# Patient Record
Sex: Female | Born: 2000 | Race: White | Hispanic: Yes | Marital: Single | Smoking: Never smoker
Health system: Southern US, Community
[De-identification: ages and names within clinical notes are randomized; demographics above are authoritative.]

## PROBLEM LIST (undated history)

## (undated) ENCOUNTER — Inpatient Hospital Stay: Payer: Self-pay

## (undated) DIAGNOSIS — F32A Depression, unspecified: Secondary | ICD-10-CM

## (undated) DIAGNOSIS — R002 Palpitations: Secondary | ICD-10-CM

## (undated) DIAGNOSIS — F419 Anxiety disorder, unspecified: Secondary | ICD-10-CM

## (undated) DIAGNOSIS — J45909 Unspecified asthma, uncomplicated: Secondary | ICD-10-CM

## (undated) DIAGNOSIS — I10 Essential (primary) hypertension: Secondary | ICD-10-CM

## (undated) DIAGNOSIS — K219 Gastro-esophageal reflux disease without esophagitis: Secondary | ICD-10-CM

## (undated) DIAGNOSIS — R030 Elevated blood-pressure reading, without diagnosis of hypertension: Secondary | ICD-10-CM

## (undated) DIAGNOSIS — J302 Other seasonal allergic rhinitis: Secondary | ICD-10-CM

## (undated) DIAGNOSIS — R011 Cardiac murmur, unspecified: Secondary | ICD-10-CM

## (undated) HISTORY — PX: OTHER SURGICAL HISTORY: SHX169

## (undated) HISTORY — DX: Elevated blood-pressure reading, without diagnosis of hypertension: R03.0

## (undated) HISTORY — DX: Anxiety disorder, unspecified: F41.9

## (undated) HISTORY — DX: Depression, unspecified: F32.A

## (undated) HISTORY — DX: Essential (primary) hypertension: I10

## (undated) HISTORY — DX: Cardiac murmur, unspecified: R01.1

---
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
## 2021-05-26 ENCOUNTER — Ambulatory Visit (INDEPENDENT_AMBULATORY_CARE_PROVIDER_SITE_OTHER): Payer: Medicaid Other | Admitting: Certified Nurse Midwife

## 2021-05-26 ENCOUNTER — Other Ambulatory Visit: Payer: Self-pay

## 2021-05-26 ENCOUNTER — Encounter: Payer: Self-pay | Admitting: Certified Nurse Midwife

## 2021-05-26 VITALS — BP 159/97 | HR 90 | Ht 64.0 in | Wt 250.6 lb

## 2021-05-26 DIAGNOSIS — Z3200 Encounter for pregnancy test, result unknown: Secondary | ICD-10-CM | POA: Diagnosis not present

## 2021-05-26 LAB — POCT URINE PREGNANCY: Preg Test, Ur: POSITIVE — AB

## 2021-05-26 MED ORDER — LABETALOL HCL 100 MG PO TABS: 100.0000 mg | ORAL_TABLET | Freq: Two times a day (BID) | ORAL | 11 refills | Status: DC

## 2021-05-26 NOTE — Progress Notes (Signed)
Subjective:    Joyce Smith is a 21 y.o. female who presents for evaluation of amenorrhea. She believes she could be pregnant. Pregnancy is desired. Sexual Activity: single partner, contraception: none. Current symptoms also include: fatigue and positive home pregnancy test. Last period was normal.   Patient's last menstrual period was 04/16/2021 (exact date). The following portions of the patient's history were reviewed and updated as appropriate: allergies, current medications, past family history, past medical history, past social history, past surgical history, and problem list.  Review of Systems Pertinent items are noted in HPI.     Objective:    BP (!) 159/97    Pulse 90    Ht 5\' 4"  (1.626 m)    Wt 250 lb 9.6 oz (113.7 kg)    LMP 04/16/2021 (Exact Date)    BMI 43.02 kg/m  General: alert, cooperative, appears stated age, and no acute distress    Lab Review Urine HCG: positive    Assessment:    Absence of menstruation.     Plan:    Pregnancy Test:  Positive: EDC: 01/21/2022. Briefly discussed pre-natal care options. Pt has hypertension not currently on medications. Dr. 01/23/2022 consulted started on labetalol 100 mg BID. Logan Bores Encouraged well-balanced diet, plenty of rest when needed, pre-natal vitamins daily and walking for exercise. Discussed self-help for nausea, avoiding OTC medications until consulting provider or pharmacist, other than Tylenol as needed, minimal caffeine (1-2 cups daily) and avoiding alcohol. She will follow up 2-3 days for BP check. She will schedule dating u/s 2 wks, nurse visit in 5 wks,   her initial OB visit in the next at [redacted] wks pregnant. . Feel free to call with any questions.   Marland Kitchen, CNM

## 2021-05-26 NOTE — Patient Instructions (Signed)
Prenatal Care ?Prenatal care is health care during pregnancy. It helps you and your unborn baby (fetus) stay as healthy as possible. Prenatal care may be provided by a midwife, a family practice doctor, a mid-level practitioner (nurse practitioner or physician assistant), or a childbirth and pregnancy doctor (obstetrician). ?How does this affect me? ?During pregnancy, you will be closely monitored for any new conditions that might develop. To lower your risk of pregnancy complications, you and your health care provider will talk about any underlying conditions you have. ?How does this affect my baby? ?Early and consistent prenatal care increases the chance that your baby will be healthy during pregnancy. Prenatal care lowers the risk that your baby will be: ?Born early (prematurely). ?Smaller than expected at birth (small for gestational age). ?What can I expect at the first prenatal care visit? ?Your first prenatal care visit will likely be the longest. You should schedule your first prenatal care visit as soon as you know that you are pregnant. Your first visit is a good time to talk about any questions or concerns you have about pregnancy. ?Medical history ?At your visit, you and your health care provider will talk about your medical history, including: ?Any past pregnancies. ?Your family's medical history. ?Medical history of the baby's father. ?Any long-term (chronic) health conditions you have and how you manage them. ?Any surgeries or procedures you have had. ?Any current over-the-counter or prescription medicines, herbs, or supplements that you are taking. ?Other factors that could pose a risk to your baby, including: ?Exposure to harmful chemicals or radiation at work or at home. ?Any substance use, including tobacco, alcohol, and drug use. ?Your home setting and your stress levels, including: ?Exposure to abuse or violence. ?Household financial strain. ?Your daily health habits, including diet and  exercise. ?Tests and screenings ?Your health care provider will: ?Measure your weight, height, and blood pressure. ?Do a physical exam, including a pelvic and breast exam. ?Perform blood tests and urine tests to check for: ?Urinary tract infection. ?Sexually transmitted infections (STIs). ?Low iron levels in your blood (anemia). ?Blood type and certain proteins on red blood cells (Rh antibodies). ?Infections and immunity to viruses, such as hepatitis B and rubella. ?HIV (human immunodeficiency virus). ?Discuss your options for genetic screening. ?Tips about staying healthy ?Your health care provider will also give you information about how to keep yourself and your baby healthy, including: ?Nutrition and taking vitamins. ?Physical activity. ?How to manage pregnancy symptoms such as nausea and vomiting (morning sickness). ?Infections and substances that may be harmful to your baby and how to avoid them. ?Food safety. ?Dental care. ?Working. ?Travel. ?Warning signs to watch for and when to call your health care provider. ?How often will I have prenatal care visits? ?After your first prenatal care visit, you will have regular visits throughout your pregnancy. The visit schedule is often as follows: ?Up to week 28 of pregnancy: once every 4 weeks. ?28-36 weeks: once every 2 weeks. ?After 36 weeks: every week until delivery. ?Some women may have visits more or less often depending on any underlying health conditions and the health of the baby. ?Keep all follow-up and prenatal care visits. This is important. ?What happens during routine prenatal care visits? ?Your health care provider will: ?Measure your weight and blood pressure. ?Check for fetal heart sounds. ?Measure the height of your uterus in your abdomen (fundal height). This may be measured starting around week 20 of pregnancy. ?Check the position of your baby inside your uterus. ?Ask questions   about your diet, sleeping patterns, and whether you can feel the baby  move. ?Review warning signs to watch for and signs of labor. ?Ask about any pregnancy symptoms you are having and how you are dealing with them. Symptoms may include: ?Headaches. ?Nausea and vomiting. ?Vaginal discharge. ?Swelling. ?Fatigue. ?Constipation. ?Changes in your vision. ?Feeling persistently sad or anxious. ?Any discomfort, including back or pelvic pain. ?Bleeding or spotting. ?Make a list of questions to ask your health care provider at your routine visits. ?What tests might I have during prenatal care visits? ?You may have blood, urine, and imaging tests throughout your pregnancy, such as: ?Urine tests to check for glucose, protein, or signs of infection. ?Glucose tests to check for a form of diabetes that can develop during pregnancy (gestational diabetes mellitus). This is usually done around week 24 of pregnancy. ?Ultrasounds to check your baby's growth and development, to check for birth defects, and to check your baby's well-being. These can also help to decide when you should deliver your baby. ?A test to check for group B strep (GBS) infection. This is usually done around week 36 of pregnancy. ?Genetic testing. This may include blood, fluid, or tissue sampling, or imaging tests, such as an ultrasound. Some genetic tests are done during the first trimester and some are done during the second trimester. ?What else can I expect during prenatal care visits? ?Your health care provider may recommend getting certain vaccines during pregnancy. These may include: ?A yearly flu shot (annual influenza vaccine). This is especially important if you will be pregnant during flu season. ?Tdap (tetanus, diphtheria, pertussis) vaccine. Getting this vaccine during pregnancy can protect your baby from whooping cough (pertussis) after birth. This vaccine may be recommended between weeks 27 and 36 of pregnancy. ?A COVID-19 vaccine. ?Later in your pregnancy, your health care provider may give you information  about: ?Childbirth and breastfeeding classes. ?Choosing a health care provider for your baby. ?Umbilical cord banking. ?Breastfeeding. ?Birth control after your baby is born. ?The hospital labor and delivery unit and how to set up a tour. ?Registering at the hospital before you go into labor. ?Where to find more information ?Office on Women's Health: womenshealth.gov ?American Pregnancy Association: americanpregnancy.org ?March of Dimes: marchofdimes.org ?Summary ?Prenatal care helps you and your baby stay as healthy as possible during pregnancy. ?Your first prenatal care visit will most likely be the longest. ?You will have visits and tests throughout your pregnancy to monitor your health and your baby's health. ?Bring a list of questions to your visits to ask your health care provider. ?Make sure to keep all follow-up and prenatal care visits. ?This information is not intended to replace advice given to you by your health care provider. Make sure you discuss any questions you have with your health care provider. ?Document Revised: 02/08/2020 Document Reviewed: 02/08/2020 ?Elsevier Patient Education ? 2022 Elsevier Inc. ? ?

## 2021-05-27 ENCOUNTER — Telehealth: Payer: Self-pay

## 2021-05-28 ENCOUNTER — Encounter: Payer: Self-pay | Admitting: Certified Nurse Midwife

## 2021-05-28 ENCOUNTER — Other Ambulatory Visit: Payer: Self-pay

## 2021-05-28 ENCOUNTER — Ambulatory Visit (INDEPENDENT_AMBULATORY_CARE_PROVIDER_SITE_OTHER): Payer: Medicaid Other | Admitting: Certified Nurse Midwife

## 2021-05-28 VITALS — BP 139/71 | HR 90 | Wt 250.5 lb

## 2021-05-28 DIAGNOSIS — O161 Unspecified maternal hypertension, first trimester: Secondary | ICD-10-CM

## 2021-05-28 MED ORDER — BLOOD PRESSURE MONITOR/L CUFF MISC: 1.0000 | Freq: Every day | 0 refills | Status: DC | PRN

## 2021-05-28 MED ORDER — METRONIDAZOLE 0.75 % VA GEL: 1.0000 | Freq: Every day | VAGINAL | 0 refills | Status: AC

## 2021-05-28 NOTE — Progress Notes (Signed)
Patient presents for BP check per AT in first trimester of pregnancy.

## 2021-05-28 NOTE — Progress Notes (Signed)
Pt bp remains elevated 143/86 , rpt after sitting for 15 min. 139/74. Dr. Valentino Saxon consulted. Labetalol increased to 200 mg BID. BP cuff ordered pt instructed to take BP if feeling light headed of dizzy. If BP low she is to return to 100 mg dose . She will follow up in office Monday for BP check. Pt state she was seen by urgent care a week ago a given a prescription flagyl due to BV. Given that she is pregnant she did not take . Order placed fo [facility] for treatment.   Doreene Burke, CNM

## 2021-06-02 ENCOUNTER — Encounter: Payer: Self-pay | Admitting: Certified Nurse Midwife

## 2021-06-02 ENCOUNTER — Ambulatory Visit: Payer: Medicaid Other

## 2021-06-03 ENCOUNTER — Encounter: Payer: Self-pay | Admitting: Certified Nurse Midwife

## 2021-06-03 ENCOUNTER — Encounter: Payer: Medicaid Other | Admitting: Certified Nurse Midwife

## 2021-06-03 ENCOUNTER — Ambulatory Visit (INDEPENDENT_AMBULATORY_CARE_PROVIDER_SITE_OTHER): Payer: Medicaid Other | Admitting: Certified Nurse Midwife

## 2021-06-03 ENCOUNTER — Other Ambulatory Visit: Payer: Self-pay

## 2021-06-03 VITALS — BP 131/84 | HR 92 | Wt 252.5 lb

## 2021-06-03 DIAGNOSIS — Z0130 Encounter for examination of blood pressure without abnormal findings: Secondary | ICD-10-CM

## 2021-06-03 NOTE — Progress Notes (Signed)
Pt presents for BP check. First BP - 148/77; made AT aware and she advised patient rest and repeat after 15 min. Second BP - 131/84, AT approved. Patient states she has only be taking 100mg  labetalol BID due to feeling clammy/fatigued when taking 200mg . pt also reports bp of 103/71 @ home when taking 200mg . AT approved until further notice for patient to continue 100mg  BID and to continue to monitor at home. Patient verbalizes understanding and agrees to plan.

## 2021-06-05 ENCOUNTER — Telehealth: Payer: Self-pay

## 2021-06-09 ENCOUNTER — Encounter: Payer: Self-pay | Admitting: Certified Nurse Midwife

## 2021-06-09 ENCOUNTER — Other Ambulatory Visit: Payer: Medicaid Other

## 2021-06-09 NOTE — Telephone Encounter (Signed)
Provider spoke with patient, all questions answered.

## 2021-06-11 ENCOUNTER — Encounter: Payer: Self-pay | Admitting: Certified Nurse Midwife

## 2021-06-11 ENCOUNTER — Other Ambulatory Visit: Payer: Self-pay

## 2021-06-11 ENCOUNTER — Ambulatory Visit (INDEPENDENT_AMBULATORY_CARE_PROVIDER_SITE_OTHER): Payer: Medicaid Other

## 2021-06-11 ENCOUNTER — Telehealth (INDEPENDENT_AMBULATORY_CARE_PROVIDER_SITE_OTHER): Payer: Medicaid Other | Admitting: Certified Nurse Midwife

## 2021-06-11 VITALS — BP 131/84 | HR 79 | Wt 249.8 lb

## 2021-06-11 DIAGNOSIS — Z3200 Encounter for pregnancy test, result unknown: Secondary | ICD-10-CM

## 2021-06-11 DIAGNOSIS — Z3401 Encounter for supervision of normal first pregnancy, first trimester: Secondary | ICD-10-CM

## 2021-06-11 DIAGNOSIS — Z3A08 8 weeks gestation of pregnancy: Secondary | ICD-10-CM

## 2021-06-11 DIAGNOSIS — O209 Hemorrhage in early pregnancy, unspecified: Secondary | ICD-10-CM

## 2021-06-11 LAB — POCT URINALYSIS DIPSTICK OB
Bilirubin, UA: NEGATIVE
Glucose, UA: NEGATIVE
Ketones, UA: NEGATIVE
Leukocytes, UA: NEGATIVE
Nitrite, UA: NEGATIVE
POC,PROTEIN,UA: NEGATIVE
Spec Grav, UA: 1.01 (ref 1.010–1.025)
Urobilinogen, UA: 0.2 E.U./dL
pH, UA: 6.5 (ref 5.0–8.0)

## 2021-06-11 NOTE — Progress Notes (Signed)
Pt presents today due to bleeding in early pregnancy. Had u/s today for dating. FHR noted 149. No evidence of subchorionic hematoma (per u/s tech. Report not yet completed). Pt state she has been having spotting , she is not wearing a pad , showed me a picture of dark streak with small amount clotting on toilet tissue. Discussed with pt that dark color is indicative of old blood and that it will hopefully be stopping soon. Discussed possible cause of bleeding including intercourse, vigorous physical activity , or constipation. Bleeding precautions reviewed. U/s tech notes some abnormalcies in decidual size and recommend follow up u/s in 2 wks. Orders placed. Follow up for rp u/s and NOB as scheduled.   Doreene Burke, CNM

## 2021-06-12 ENCOUNTER — Telehealth: Payer: Self-pay | Admitting: Emergency Medicine

## 2021-06-12 ENCOUNTER — Encounter: Payer: Self-pay | Admitting: Emergency Medicine

## 2021-06-12 ENCOUNTER — Emergency Department: Payer: Medicaid Other

## 2021-06-12 ENCOUNTER — Other Ambulatory Visit: Payer: Self-pay

## 2021-06-12 ENCOUNTER — Emergency Department
Admission: EM | Admit: 2021-06-12 | Discharge: 2021-06-12 | Disposition: A | Discharge status: Home / Self Care | Payer: Medicaid Other | Attending: Emergency Medicine | Admitting: Emergency Medicine

## 2021-06-12 DIAGNOSIS — O209 Hemorrhage in early pregnancy, unspecified: Secondary | ICD-10-CM | POA: Diagnosis present

## 2021-06-12 DIAGNOSIS — Z3A01 Less than 8 weeks gestation of pregnancy: Secondary | ICD-10-CM | POA: Diagnosis not present

## 2021-06-12 DIAGNOSIS — O4690 Antepartum hemorrhage, unspecified, unspecified trimester: Secondary | ICD-10-CM

## 2021-06-12 LAB — COMPREHENSIVE METABOLIC PANEL
ALT: 14 U/L (ref 0–44)
AST: 17 U/L (ref 15–41)
Albumin: 4.2 g/dL (ref 3.5–5.0)
Alkaline Phosphatase: 66 U/L (ref 38–126)
Anion gap: 6 (ref 5–15)
BUN: 6 mg/dL (ref 6–20)
CO2: 24 mmol/L (ref 22–32)
Calcium: 9.1 mg/dL (ref 8.9–10.3)
Chloride: 105 mmol/L (ref 98–111)
Creatinine, Ser: 0.52 mg/dL (ref 0.44–1.00)
GFR, Estimated: >60 mL/min (ref >60)
Glucose, Bld: 101 mg/dL — ABNORMAL HIGH (ref 70–99)
Potassium: 3.8 mmol/L (ref 3.5–5.1)
Sodium: 135 mmol/L (ref 135–145)
Total Bilirubin: 0.6 mg/dL (ref 0.3–1.2)
Total Protein: 7.7 g/dL (ref 6.5–8.1)

## 2021-06-12 LAB — CBC WITH DIFFERENTIAL/PLATELET
Abs Immature Granulocytes: 0.03 K/uL (ref 0.00–0.07)
Basophils Absolute: 0.1 K/uL (ref 0.0–0.1)
Basophils Relative: 1 %
Eosinophils Absolute: 0.5 K/uL (ref 0.0–0.5)
Eosinophils Relative: 4 %
HCT: 39.5 % (ref 36.0–46.0)
Hemoglobin: 12.7 g/dL (ref 12.0–15.0)
Immature Granulocytes: 0 %
Lymphocytes Relative: 13 %
Lymphs Abs: 1.4 K/uL (ref 0.7–4.0)
MCH: 27.1 pg (ref 26.0–34.0)
MCHC: 32.2 g/dL (ref 30.0–36.0)
MCV: 84.2 fL (ref 80.0–100.0)
Monocytes Absolute: 0.6 K/uL (ref 0.1–1.0)
Monocytes Relative: 5 %
Neutro Abs: 8.8 K/uL — ABNORMAL HIGH (ref 1.7–7.7)
Neutrophils Relative %: 77 %
Platelets: 331 K/uL (ref 150–400)
RBC: 4.69 MIL/uL (ref 3.87–5.11)
RDW: 13 % (ref 11.5–15.5)
WBC: 11.4 K/uL — ABNORMAL HIGH (ref 4.0–10.5)
nRBC: 0 % (ref 0.0–0.2)

## 2021-06-12 LAB — ABO/RH: ABO/RH(D): O POS

## 2021-06-12 LAB — WET PREP, GENITAL
Clue Cells Wet Prep HPF POC: NONE SEEN
Sperm: NONE SEEN
Trich, Wet Prep: NONE SEEN
WBC, Wet Prep HPF POC: <10 (ref <10)
Yeast Wet Prep HPF POC: NONE SEEN

## 2021-06-12 LAB — HCG, QUANTITATIVE, PREGNANCY: hCG, Beta Chain, Quant, S: 12750 m[IU]/mL — ABNORMAL HIGH (ref <5)

## 2021-06-12 LAB — POC URINE PREG, ED: Preg Test, Ur: POSITIVE — AB

## 2021-06-12 NOTE — Telephone Encounter (Unsigned)
This RN received phone call from lab stating unable to run GC/Chlamydia test, Joyce Smith made aware, acknowledges, states patient does not need to come back for recollection at this time.

## 2021-06-12 NOTE — ED Triage Notes (Signed)
Patient to ED for vaginal bleeding. Patient states she is [redacted] weeks pregnant. She states having light bleeding for the past week but became heavier today. Patient ambulatory to triage.

## 2021-06-12 NOTE — ED Notes (Signed)
Pt reports a week of light brown blood upon wiping. States is [redacted] weeks pregnant. Reports bleeding today a little bit more; brighter in color and noted on underwear per pt. States had ultrasound yesterday and doc told her there were changes to it that she wasn't expecting but didn't go into more detail. Pt reports cramping.

## 2021-06-12 NOTE — ED Provider Notes (Signed)
[facility] Provider Note  Patient Contact: 4:43 PM (approximate)   History   Vaginal Bleeding   HPI  DEMESHIA Smith is a 21 y.o. female who presents to the emergency department concern for vaginal bleeding in the setting of pregnancy.  Patient does have an OB/GYN, was seen yesterday and had an ultrasound which revealed a possible abnormality in the gestational sac.  Patient states that she is scheduled for repeat ultrasound in 2 weeks.  She has had some brown discharge and had been recently treated for BV.  Patient was using Flagyl suppositories for same.  She was told that if she had any true vaginal bleeding she should present to the ED and roughly 1:00 this afternoon patient had some bright red blood.  Patient has had some mild pelvic cramping but no frank pain.  No emesis, diarrhea, urinary changes.  Patient was 7 weeks 3 days by measurement on yesterday's ultrasound.     Physical Exam   Triage Vital Signs: ED Triage Vitals  Enc Vitals Group     BP 06/12/21 1517 138/80     Pulse Rate 06/12/21 1517 78     Resp 06/12/21 1517 17     Temp 06/12/21 1517 98.5 F (36.9 C)     Temp Source 06/12/21 1517 Oral     SpO2 06/12/21 1517 99 %     Weight 06/12/21 1513 250 lb (113.4 kg)     Height 06/12/21 1513 5\' 4"  (1.626 m)     Head Circumference --      Peak Flow --      Pain Score 06/12/21 1513 6     Pain Loc --      Pain Edu? --      Excl. in GC? --     Most recent vital signs: Vitals:   06/12/21 1600 06/12/21 1823  BP: (!) 154/64 (!) 148/68  Pulse: 75 70  Resp:  16  Temp:    SpO2: 97% 98%     General: Alert and in no acute distress.  Cardiovascular:  Good peripheral perfusion Respiratory: Normal respiratory effort without tachypnea or retractions. Lungs CTAB.  Gastrointestinal: Bowel sounds 4 quadrants. Soft and nontender to palpation. No guarding or rigidity. No palpable masses. No distention. No CVA tenderness. Genitourinary: External  genital exam revealed no lesions.  Pelvic exam revealed no vaginal tears.  Small amount of congealed blood noted in the vaginal canal.  No other frank discharge.  Cervix closed and unremarkable at this time.  Swabs are obtained Musculoskeletal: Full range of motion to all extremities.  Neurologic:  No gross focal neurologic deficits are appreciated.  Skin:   No rash noted Other:   Pelvic exam is chaperoned by female 08/10/21 and female PA student.  ED Results / Procedures / Treatments   Labs (all labs ordered are listed, but only abnormal results are displayed) Labs Reviewed  CBC WITH DIFFERENTIAL/PLATELET - Abnormal; Notable for the following components:      Result Value   WBC 11.4 (*)    Neutro Abs 8.8 (*)    All other components within normal limits  COMPREHENSIVE METABOLIC PANEL - Abnormal; Notable for the following components:   Glucose, Bld 101 (*)    All other components within normal limits  HCG, QUANTITATIVE, PREGNANCY - Abnormal; Notable for the following components:   hCG, Beta Chain, Quant, S 12,750 (*)    All other components within normal limits  POC URINE PREG, ED - Abnormal; Notable  for the following components:   Preg Test, Ur POSITIVE (*)    All other components within normal limits  WET PREP, GENITAL  CHLAMYDIA/NGC RT PCR ([facility] ONLY)            ABO/RH     EKG     RADIOLOGY  I personally viewed and evaluated these images as part of my medical decision making, as well as reviewing the written report by the radiologist.  ED Provider Interpretation: Intrauterine pregnancy measuring roughly 7 weeks 3 days.  No evidence of subchorionic hemorrhage  US OB LESS THAN 14 WEEKS WITH OB TRANSVAGINAL  Result Date: 06/12/2021 CLINICAL DATA:  Vaginal bleeding EXAM: OBSTETRIC <14 WK US AND TRANSVAGINAL OB US TECHNIQUE: Both transabdominal and transvaginal ultrasound examinations were performed for complete evaluation of the gestation as well as the maternal uterus, adnexal  regions, and pelvic cul-de-sac. Transvaginal technique was performed to assess early pregnancy. COMPARISON:  06/11/2021 FINDINGS: Intrauterine gestational sac: Single Yolk sac:  Visualized. Embryo:  Visualized. Cardiac Activity: Visualized. Heart Rate: 162 bpm CRL:  12.3 mm   7 w   3 d                  US EDC: 01/26/2022 Subchorionic hemorrhage:  None visualized. Maternal uterus/adnexae: Left ovary measures 3.5 x 2.0 x 2.4 cm. Right ovary is not well seen. No free fluid. IMPRESSION: 1. Single live intrauterine pregnancy as above, estimated age 21 weeks and 3 days. Electronically Signed   By: Sharlet SalinaMichael  Brown M.D.   On: 06/12/2021 17:49    PROCEDURES:  Critical Care performed: No  Procedures   MEDICATIONS ORDERED IN ED: Medications - No data to display   IMPRESSION / MDM / ASSESSMENT AND PLAN / ED COURSE  I reviewed the triage vital signs and the nursing notes.                              Differential diagnosis includes, but is not limited to, vaginal bleeding in pregnancy, BV, candidal infection, subchorionic hemorrhage, miscarriage   Patient's diagnosis is consistent with vaginal bleeding in pregnancy.  Patient presented to the emergency department complaining of some vaginal bleeding.  Patient was seen yesterday with her OB/GYN, had an ultrasound which had some concerning findings on gestational sac.  She was advised to present to the ED for any vaginal bleeding and had a small amount of bleeding today.  Largely asymptomatic other than vaginal bleeding at this time.  Labs are reassuring, patient with reassuring ultrasound with intrauterine pregnancy with no evidence of subchorionic hemorrhage.  At this time patient also had a pelvic exam and she said that she had some vaginal itching and was recently diagnosed with BV though she had taken antibiotic suppositories for same.  Patient had no clue cells, yeast, trichomoniasis.  Gonorrhea and Chlamydia had also been ordered but patient states that  she has no concerns for either and does not wish to wait for the return of the swabs.  If the swabs are positive she may return for antibiotics.  Otherwise follow-up with OB/GYN as previously scheduled.  Return precautions discussed with the patient.  No medications prescribed at this time..  Patient is given ED precautions to return to the ED for any worsening or new symptoms.        FINAL CLINICAL IMPRESSION(S) / ED DIAGNOSES   Final diagnoses:  Vaginal bleeding in pregnancy     Rx / DC  Orders   ED Discharge Orders     None        Note:  This document was prepared using Dragon voice recognition software and may include unintentional dictation errors.   Lanette Hampshire 06/12/21 2036    Shaune Pollack, MD 06/12/21 2239

## 2021-06-20 ENCOUNTER — Emergency Department
Admission: EM | Admit: 2021-06-20 | Discharge: 2021-06-20 | Disposition: A | Discharge status: Home / Self Care | Payer: Medicaid Other | Attending: Emergency Medicine | Admitting: Emergency Medicine

## 2021-06-20 ENCOUNTER — Other Ambulatory Visit: Payer: Self-pay

## 2021-06-20 ENCOUNTER — Encounter: Payer: Self-pay | Admitting: Emergency Medicine

## 2021-06-20 DIAGNOSIS — O26851 Spotting complicating pregnancy, first trimester: Secondary | ICD-10-CM | POA: Insufficient documentation

## 2021-06-20 DIAGNOSIS — O034 Incomplete spontaneous abortion without complication: Secondary | ICD-10-CM | POA: Diagnosis present

## 2021-06-20 DIAGNOSIS — Z3A08 8 weeks gestation of pregnancy: Secondary | ICD-10-CM | POA: Diagnosis not present

## 2021-06-20 NOTE — Discharge Instructions (Signed)
As we discussed, it appears that you are having a miscarriage and that your body is going through the natural process.  Dr. Logan Bores at [facility] agrees that most likely you will complete this process without requiring any medication or procedures.  However we recommend that you call the clinic in the morning and tell them what is going on, including that you came to the emergency department and that the ER doctor talked with Dr. Logan Bores, and see if they can see you today for a follow-up appointment.  If at any point you develop worsening bleeding or develop new symptoms that concern you, please go directly to the clinic or return to the nearest emergency department.  Drink plenty of fluids to stay hydrated and use over-the-counter Tylenol according to label instructions for pain.

## 2021-06-20 NOTE — ED Provider Notes (Signed)
[facility] Provider Note    Event Date/Time   First MD Initiated Contact with Patient 06/20/21 0413     (approximate)   History   Vaginal Bleeding   HPI  Joyce Smith is a 21 y.o. female    who presents for vaginal bleeding and passage of what appeared to be products of conception during early pregnancy.  She reports that she is a G1 P0 at approximately [redacted] weeks gestation who has been followed by Doreene Burke at [facility].  The patient reports that she has been having vaginal bleeding throughout the pregnancy.  However today it got significantly worse and tonight she started having worsening cramps.  The blood flow is heavier.  She went to the bathroom thinking that she did have a bowel movement and when she bears down she passed what appears to be a gestational sac which she brought with her in a plastic bag.  She came to the ED for further evaluation.  She reports still having some vaginal bleeding and pelvic cramping.  No other passage of tissue.     Physical Exam   Triage Vital Signs: ED Triage Vitals  Enc Vitals Group     BP 06/20/21 0402 (!) 135/59     Pulse Rate 06/20/21 0402 81     Resp 06/20/21 0402 18     Temp 06/20/21 0402 98.5 F (36.9 C)     Temp Source 06/20/21 0402 Oral     SpO2 06/20/21 0402 100 %     Weight 06/20/21 0359 113.4 kg (250 lb)     Height 06/20/21 0359 1.626 m (5\' 4" )     Head Circumference --      Peak Flow --      Pain Score 06/20/21 0359 10     Pain Loc --      Pain Edu? --      Excl. in GC? --     Most recent vital signs: Vitals:   06/20/21 0402 06/20/21 0507  BP: (!) 135/59 (!) 118/55  Pulse: 81 77  Resp: 18 15  Temp: 98.5 F (36.9 C)   SpO2: 100% 100%     General: Awake, no physical distress, emotionally upset. CV:  Good peripheral perfusion.  Resp:  Normal effort.  Abd:  No distention.  No guarding, no rebound, no tenderness to palpation. Other:  Normal external pelvic exam.   Moderate amount of blood in vagina.  Products of conception in cervical os, easily removed with ring forceps.  Some difficulty visualizing cervical os, cannot completely verify that no other products are in place, but digital exam also did not reveal any additional products of conception.  No significant tenderness to palpation or cervical motion tenderness on digital exam.  ED nurse present throughout exam as chaperone.   ED Results / Procedures / Treatments    PROCEDURES:  Critical Care performed: No  Procedures   MEDICATIONS ORDERED IN ED: Medications - No data to display   IMPRESSION / MDM / ASSESSMENT AND PLAN / ED COURSE  I reviewed the triage vital signs and the nursing notes.                              Differential diagnosis includes, but is not limited to, spontaneous abortion of any number of Fridays (inevitable, completed, etc.), nonviable pregnancy, subchorionic hemorrhage, placental abruption.  Physically the patient is doing well and is hemodynamically stable.  She is obviously upset over the probable pregnancy loss.  She has what appears to be an intact gestational sac with her in a plastic baggy at bedside.  No significant tenderness to palpation of the abdomen and no guarding.  We will perform a pelvic exam to look for indication of additional products of conception within the vagina or cervical os.  I will then likely touch base with OB/GYN (Dr. Logan Bores is on-call for [facility]) and discuss additional management which may or may not include possibilities such as ultrasound, pathology for the products of conception, and/or close follow-up in clinic today.  I reviewed the patient's medical record including blood test from about a week ago (06/12/2021) indicating a beta-hCG of 12,750 and ABO/Rh indicating that she has O+ blood.  No indication for RhoGAM.  I also reviewed the ED visit including ultrasound from 06/12/2020 indicating a single live intrauterine pregnancy at  approximately 7 weeks and 3 days gestation.  At that time there is no evidence of any subchorionic hemorrhage.   Clinical Course as of 06/20/21 0512  Fri Jun 20, 2021  [id number] Performed pelvic exam with results as documented above: There were some products of conception in the cervical os which I removed with ring forceps.  Moderate amount of vaginal bleeding which increased briefly after removing the proximal of conception but then stabilized.  Patient is otherwise asymptomatic with no lightheadedness or symptomatic anemia.  I called and discussed the case by phone with Dr. Logan Bores with [facility].  He agreed with my management thus far and agreed that lab work was not necessary, including CBC, hCG, or repeat blood type.  He agreed that an ultrasound is unlikely to be helpful since the miscarriage is still in proress. He would not recommend pathology for the products of conception.  He would not recommend any medication management.  He said that most likely the miscarriage will proceed naturally.  He agreed with my plan that the patient should call in [facility] first thing in the morning and schedule follow-up appointment.  He stressed that if she is having worsening bleeding that she should definitely come to the clinic and at that point she may need a D&C, but at this point she should be able to be managed conservatively.  I went over all of this with the patient and she understands and agrees.  I gave strict return precautions and follow-up recommendations and she agrees with the plan. [CF]    Clinical Course User Index [CF] Loleta Rose, MD     FINAL CLINICAL IMPRESSION(S) / ED DIAGNOSES   Final diagnoses:  Incomplete miscarriage     Rx / DC Orders   ED Discharge Orders     None        Note:  This document was prepared using Dragon voice recognition software and may include unintentional dictation errors.   Loleta Rose, MD 06/20/21 [phone]

## 2021-06-20 NOTE — ED Triage Notes (Signed)
Pt to triage via w/c, tearful; G1, EDC 9/17 (approx [redacted]wks pregnant), pt at [facility] with vag bleeding throughout pregnancy; O+; reports cont lower abd cramping, bleeding and passed "sac" tonight

## 2021-06-23 ENCOUNTER — Ambulatory Visit (INDEPENDENT_AMBULATORY_CARE_PROVIDER_SITE_OTHER): Payer: Medicaid Other | Admitting: Certified Nurse Midwife

## 2021-06-23 ENCOUNTER — Other Ambulatory Visit: Payer: Self-pay

## 2021-06-23 ENCOUNTER — Encounter: Payer: Self-pay | Admitting: Certified Nurse Midwife

## 2021-06-23 VITALS — BP 145/97 | HR 90 | Ht 64.0 in | Wt 253.1 lb

## 2021-06-23 DIAGNOSIS — O039 Complete or unspecified spontaneous abortion without complication: Secondary | ICD-10-CM | POA: Insufficient documentation
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
## 2021-06-23 NOTE — Patient Instructions (Signed)
Miscarriage °A miscarriage is the loss of a pregnancy before the 20th week of pregnancy. Sometimes, a pregnancy ends before a woman knows that she is pregnant. °If you lose a pregnancy, talk with your doctor about: °Questions you have about the loss of your baby. °How to work through your grief. °Plans for future pregnancy. °What are the causes? °Many times, the cause of this condition is not known. °What increases the risk? °These things may make a pregnant woman more likely to lose a pregnancy: °Certain health conditions °Conditions that affect hormones, such as: °Thyroid disease. °Polycystic ovary syndrome. °Diabetes. °A disease that causes the body's disease-fighting system to attack itself by mistake. °Infections. °Bleeding problems. °Being very overweight. °Lifestyle factors °Using products that have tobacco or nicotine in them. °Being around tobacco smoke. °Having alcohol. °Having a lot of caffeine. °Using drugs. °Problems with reproductive organs or parts °Having a cervix that opens and thins before your due date. The cervix is the lowest part of your womb. °Having Asherman syndrome, which leads to: °Scars in the womb. °The womb being abnormal in shape. °Growths (fibroids) in the womb. °Problems in the body that are present at birth. °Infection of the cervix or womb. °Personal or health history °Injury. °Having lost a pregnancy before. °Being younger than age 18 or older than age 35. °Being around a harmful substance, such as radiation. °Having lead or other heavy metals in: °Things you eat or drink. °The air around you. °Using certain medicines. °What are the signs or symptoms? °Blood or spots of blood coming from the vagina. You may also have cramps or pain. °Pain or cramps in the belly or low back. °Fluid or tissue coming out of the vagina. °How is this treated? °Sometimes, treatment is not needed. °If you need treatment, you may be treated with: °A procedure to open the cervix more and take tissue out of  the womb. °Medicines. You may get a shot of medicine called Rho(D) immune globulin. °Follow these instructions at home: °Medicines °Take over-the-counter and prescription medicines only as told by your doctor. °If you were prescribed antibiotic medicine, take it as told by your doctor. Do not stop taking it even if you start to feel better. °Activity °Rest as told by your doctor. Ask your doctor what activities are safe for you. °Have someone help you at home during this time. °General instructions ° °Watch how much tissue comes out of the vagina. °Watch the size of any blood clots that come out of the vagina. °Do not have sex or douche until your doctor says it is okay. °Do not put things, such as tampons, in your vagina until your doctor says it is okay. °To help you and your partner with grieving: °Talk with your doctor. °See a counselor. °When you are ready, talk with your doctor about: °Things to do for your health. °How you can be healthy if you get pregnant again. °Keep all follow-up visits. °Where to find more information °The American College of Obstetricians and Gynecologists: acog.org °[place] Department of Health and Human Services Office of Women's Health: hrsa.gov/office-womens-health °Contact a doctor if: °You have a fever or chills. °There is bad-smelling fluid coming from the vagina. °You have more bleeding. °Tissue or clots of blood come out of your vagina. °Get help right away if: °You have very bad cramps or pain in your back or belly. °You soak more than 2 large pads in an hour for more than 2 hours. °You get light-headed or weak. °You faint. °  You feel sad, and you have sad thoughts a lot of the time. You think about hurting yourself. Get help right awayif you feel like you may hurt yourself or others, or have thoughts about taking your own life. Go to your nearest emergency room or: Call your local emergency services (911 in the [place] Call the National Suicide Prevention Lifeline at  772-633-8369 or 988 in the [place] This is open 24 hours a day. Text the Crisis Text Line at (585) 615-4421. Summary A miscarriage is the loss of a pregnancy before the 20th week of pregnancy. Sometimes, a pregnancy ends before a woman knows that she is pregnant. Follow instructions from your doctor about medicines and activity. To help you and your partner with grieving, talk with your doctor or a counselor. Keep all follow-up visits. This information is not intended to replace advice given to you by your health care provider. Make sure you discuss any questions you have with your health care provider. Document Revised: 11/20/2020 Document Reviewed: 10/27/2019 Elsevier Patient Education  2022 [employer].

## 2021-06-24 ENCOUNTER — Encounter: Payer: Self-pay | Admitting: Certified Nurse Midwife

## 2021-06-24 LAB — CBC
Hematocrit: 38.9 % (ref 34.0–46.6)
Hemoglobin: 12.2 g/dL (ref 11.1–15.9)
MCH: 26.2 pg — ABNORMAL LOW (ref 26.6–33.0)
MCHC: 31.4 g/dL — ABNORMAL LOW (ref 31.5–35.7)
MCV: 84 fL (ref 79–97)
Platelets: 326 10*3/uL (ref 150–450)
RBC: 4.65 x10E6/uL (ref 3.77–5.28)
RDW: 12.9 % (ref 11.7–15.4)
WBC: 8.6 10*3/uL (ref 3.4–10.8)

## 2021-06-24 LAB — BETA HCG QUANT (REF LAB): hCG Quant: 464 m[IU]/mL

## 2021-06-25 ENCOUNTER — Other Ambulatory Visit: Payer: Medicaid Other

## 2021-06-27 ENCOUNTER — Other Ambulatory Visit: Payer: Medicaid Other

## 2021-06-27 ENCOUNTER — Other Ambulatory Visit: Payer: Self-pay | Admitting: Certified Nurse Midwife

## 2021-06-27 ENCOUNTER — Other Ambulatory Visit: Payer: Self-pay

## 2021-06-27 ENCOUNTER — Ambulatory Visit (INDEPENDENT_AMBULATORY_CARE_PROVIDER_SITE_OTHER): Payer: Medicaid Other | Admitting: Certified Nurse Midwife

## 2021-06-27 DIAGNOSIS — Z0130 Encounter for examination of blood pressure without abnormal findings: Secondary | ICD-10-CM | POA: Diagnosis not present

## 2021-06-27 DIAGNOSIS — O039 Complete or unspecified spontaneous abortion without complication: Secondary | ICD-10-CM | POA: Diagnosis not present

## 2021-06-27 DIAGNOSIS — Z8759 Personal history of other complications of pregnancy, childbirth and the puerperium: Secondary | ICD-10-CM

## 2021-06-27 NOTE — Progress Notes (Signed)
Patient is here for a blood pressure check. Patient denies chest pain, palpitations, shortness of breath or visual disturbances. At previous visit blood pressure was 145/97 with a heart rate of 90. Today during nurse visit first check blood pressure was 132/72 with a heart rate of 76. She does take Labetalol for her blood pressure. She has not missed any doses of medication. She also had her beta HCG drawn today.

## 2021-06-28 ENCOUNTER — Other Ambulatory Visit: Payer: Self-pay | Admitting: Certified Nurse Midwife

## 2021-06-28 ENCOUNTER — Encounter: Payer: Self-pay | Admitting: Certified Nurse Midwife

## 2021-06-28 DIAGNOSIS — O039 Complete or unspecified spontaneous abortion without complication: Secondary | ICD-10-CM

## 2021-06-28 LAB — BETA HCG QUANT (REF LAB): hCG Quant: 61 m[IU]/mL

## 2021-07-01 ENCOUNTER — Other Ambulatory Visit (INDEPENDENT_AMBULATORY_CARE_PROVIDER_SITE_OTHER): Payer: Medicaid Other

## 2021-07-01 ENCOUNTER — Other Ambulatory Visit: Payer: Self-pay | Admitting: Certified Nurse Midwife

## 2021-07-01 ENCOUNTER — Other Ambulatory Visit: Payer: Medicaid Other

## 2021-07-01 ENCOUNTER — Other Ambulatory Visit: Payer: Self-pay

## 2021-07-01 DIAGNOSIS — O200 Threatened abortion: Secondary | ICD-10-CM

## 2021-07-01 DIAGNOSIS — O039 Complete or unspecified spontaneous abortion without complication: Secondary | ICD-10-CM

## 2021-07-02 ENCOUNTER — Encounter: Payer: Self-pay | Admitting: Certified Nurse Midwife

## 2021-07-02 ENCOUNTER — Other Ambulatory Visit: Payer: Self-pay | Admitting: Certified Nurse Midwife

## 2021-07-02 ENCOUNTER — Ambulatory Visit: Admission: RE | Admit: 2021-07-02 | Payer: Medicaid Other | Source: Ambulatory Visit

## 2021-07-02 DIAGNOSIS — O039 Complete or unspecified spontaneous abortion without complication: Secondary | ICD-10-CM

## 2021-07-02 LAB — BETA HCG QUANT (REF LAB): hCG Quant: 12 m[IU]/mL

## 2021-07-09 ENCOUNTER — Other Ambulatory Visit: Payer: Medicaid Other

## 2021-07-09 ENCOUNTER — Other Ambulatory Visit: Payer: Self-pay

## 2021-07-09 DIAGNOSIS — O039 Complete or unspecified spontaneous abortion without complication: Secondary | ICD-10-CM

## 2021-07-10 ENCOUNTER — Encounter: Payer: Self-pay | Admitting: Obstetrics

## 2021-07-10 LAB — BETA HCG QUANT (REF LAB): hCG Quant: 2 m[IU]/mL

## 2021-07-22 ENCOUNTER — Encounter: Payer: Medicaid Other | Admitting: Certified Nurse Midwife

## 2021-08-27 ENCOUNTER — Ambulatory Visit (INDEPENDENT_AMBULATORY_CARE_PROVIDER_SITE_OTHER): Payer: Medicaid Other | Admitting: Certified Nurse Midwife

## 2021-08-27 ENCOUNTER — Encounter: Payer: Self-pay | Admitting: Certified Nurse Midwife

## 2021-08-27 VITALS — BP 137/90 | HR 103 | Ht 64.0 in | Wt 260.1 lb

## 2021-08-27 DIAGNOSIS — Z3200 Encounter for pregnancy test, result unknown: Secondary | ICD-10-CM

## 2021-08-27 DIAGNOSIS — I10 Essential (primary) hypertension: Secondary | ICD-10-CM | POA: Insufficient documentation

## 2021-08-27 DIAGNOSIS — O10919 Unspecified pre-existing hypertension complicating pregnancy, unspecified trimester: Secondary | ICD-10-CM | POA: Insufficient documentation

## 2021-08-27 LAB — POCT URINE PREGNANCY: Preg Test, Ur: POSITIVE — AB

## 2021-08-27 MED ORDER — DOXYLAMINE-PYRIDOXINE 10-10 MG PO TBEC: 1.0000 | DELAYED_RELEASE_TABLET | Freq: Four times a day (QID) | ORAL | 5 refills | Status: DC

## 2021-08-27 MED ORDER — BLOOD PRESSURE MONITOR/L CUFF MISC: 1.0000 | Freq: Every day | 0 refills | Status: DC | PRN

## 2021-08-27 NOTE — Patient Instructions (Signed)
Prenatal Care Prenatal care is health care during pregnancy. It helps you and your unborn baby (fetus) stay as healthy as possible. Prenatal care may be provided by a midwife, a family practice doctor, a mid-level practitioner (nurse practitioner or physician assistant), or a childbirth and pregnancy doctor (obstetrician). How does this affect me? During pregnancy, you will be closely monitored for any new conditions that might develop. To lower your risk of pregnancy complications, you and your health care provider will talk about any underlying conditions you have. How does this affect my baby? Early and consistent prenatal care increases the chance that your baby will be healthy during pregnancy. Prenatal care lowers the risk that your baby will be: Born early (prematurely). Smaller than expected at birth (small for gestational age). What can I expect at the first prenatal care visit? Your first prenatal care visit will likely be the longest. You should schedule your first prenatal care visit as soon as you know that you are pregnant. Your first visit is a good time to talk about any questions or concerns you have about pregnancy. Medical history At your visit, you and your health care provider will talk about your medical history, including: Any past pregnancies. Your family's medical history. Medical history of the baby's father. Any long-term (chronic) health conditions you have and how you manage them. Any surgeries or procedures you have had. Any current over-the-counter or prescription medicines, herbs, or supplements that you are taking. Other factors that could pose a risk to your baby, including: Exposure to harmful chemicals or radiation at work or at home. Any substance use, including tobacco, alcohol, and drug use. Your home setting and your stress levels, including: Exposure to abuse or violence. Household financial strain. Your daily health habits, including diet and  exercise. Tests and screenings Your health care provider will: Measure your weight, height, and blood pressure. Do a physical exam, including a pelvic and breast exam. Perform blood tests and urine tests to check for: Urinary tract infection. Sexually transmitted infections (STIs). Low iron levels in your blood (anemia). Blood type and certain proteins on red blood cells (Rh antibodies). Infections and immunity to viruses, such as hepatitis B and rubella. HIV (human immunodeficiency virus). Discuss your options for genetic screening. Tips about staying healthy Your health care provider will also give you information about how to keep yourself and your baby healthy, including: Nutrition and taking vitamins. Physical activity. How to manage pregnancy symptoms such as nausea and vomiting (morning sickness). Infections and substances that may be harmful to your baby and how to avoid them. Food safety. Dental care. Working. Travel. Warning signs to watch for and when to call your health care provider. How often will I have prenatal care visits? After your first prenatal care visit, you will have regular visits throughout your pregnancy. The visit schedule is often as follows: Up to week 28 of pregnancy: once every 4 weeks. 28-36 weeks: once every 2 weeks. After 36 weeks: every week until delivery. Some women may have visits more or less often depending on any underlying health conditions and the health of the baby. Keep all follow-up and prenatal care visits. This is important. What happens during routine prenatal care visits? Your health care provider will: Measure your weight and blood pressure. Check for fetal heart sounds. Measure the height of your uterus in your abdomen (fundal height). This may be measured starting around week 20 of pregnancy. Check the position of your baby inside your uterus. Ask questions   about your diet, sleeping patterns, and whether you can feel the baby  move. Review warning signs to watch for and signs of labor. Ask about any pregnancy symptoms you are having and how you are dealing with them. Symptoms may include: Headaches. Nausea and vomiting. Vaginal discharge. Swelling. Fatigue. Constipation. Changes in your vision. Feeling persistently sad or anxious. Any discomfort, including back or pelvic pain. Bleeding or spotting. Make a list of questions to ask your health care provider at your routine visits. What tests might I have during prenatal care visits? You may have blood, urine, and imaging tests throughout your pregnancy, such as: Urine tests to check for glucose, protein, or signs of infection. Glucose tests to check for a form of diabetes that can develop during pregnancy (gestational diabetes mellitus). This is usually done around week 24 of pregnancy. Ultrasounds to check your baby's growth and development, to check for birth defects, and to check your baby's well-being. These can also help to decide when you should deliver your baby. A test to check for group B strep (GBS) infection. This is usually done around week 36 of pregnancy. Genetic testing. This may include blood, fluid, or tissue sampling, or imaging tests, such as an ultrasound. Some genetic tests are done during the first trimester and some are done during the second trimester. What else can I expect during prenatal care visits? Your health care provider may recommend getting certain vaccines during pregnancy. These may include: A yearly flu shot (annual influenza vaccine). This is especially important if you will be pregnant during flu season. Tdap (tetanus, diphtheria, pertussis) vaccine. Getting this vaccine during pregnancy can protect your baby from whooping cough (pertussis) after birth. This vaccine may be recommended between weeks 27 and 36 of pregnancy. A COVID-19 vaccine. Later in your pregnancy, your health care provider may give you information  about: Childbirth and breastfeeding classes. Choosing a health care provider for your baby. Umbilical cord banking. Breastfeeding. Birth control after your baby is born. The hospital labor and delivery unit and how to set up a tour. Registering at the hospital before you go into labor. Where to find more information Office on Women's Health: womenshealth.gov American Pregnancy Association: americanpregnancy.org March of Dimes: marchofdimes.org Summary Prenatal care helps you and your baby stay as healthy as possible during pregnancy. Your first prenatal care visit will most likely be the longest. You will have visits and tests throughout your pregnancy to monitor your health and your baby's health. Bring a list of questions to your visits to ask your health care provider. Make sure to keep all follow-up and prenatal care visits. This information is not intended to replace advice given to you by your health care provider. Make sure you discuss any questions you have with your health care provider. Document Revised: 02/08/2020 Document Reviewed: 02/08/2020 Elsevier Patient Education  2023 Elsevier Inc.  

## 2021-08-27 NOTE — Progress Notes (Signed)
Subjective:  ? ? Joyce Smith is a 21 y.o. female who presents for evaluation of amenorrhea. She believes she could be pregnant. Pregnancy is desired. Sexual Activity: single partner, contraception: none. Current symptoms also include: nausea. Last period was normal. ?  ?Patient's last menstrual period was 07/27/2021. ?The following portions of the patient's history were reviewed and updated as appropriate: allergies, current medications, past family history, past medical history, past social history, past surgical history, and problem list. ? ?Review of Systems ?Pertinent items are noted in HPI.   ?  ?Objective:  ? ? BP (!) 144/90   Pulse (!) 103   Ht 5\' 4"  (1.626 m)   Wt 260 lb 1.6 oz (118 kg)   LMP 07/27/2021   Breastfeeding No   BMI 44.65 kg/m?  ?Repeat BP 137/90 ?General: alert, cooperative, appears stated age, mild distress, and no acute distress   ? ?Lab Review ?Urine HCG: positive  ?  ?Assessment:  ? ? Absence of menstruation.   ?  ?Plan:  ? ? Pregnancy Test:  Positive: EDC: 05/03/2022. Briefly discussed pre-natal care options.  Encouraged well-balanced diet, plenty of rest when needed, pre-natal vitamins daily and walking for exercise. Discussed self-help for nausea, avoiding OTC medications until consulting provider or pharmacist, other than Tylenol as needed, minimal caffeine (1-2 cups daily) and avoiding alcohol. She will schedule u/s 1-2 wks, nurse visit @ 120 wks and  her initial OB visit iat [redacted] wks pregnant.  Feel free to call with any questions.  ? ?05/05/2022, CNM  ?

## 2021-09-05 ENCOUNTER — Encounter: Payer: Self-pay | Admitting: Certified Nurse Midwife

## 2021-09-16 ENCOUNTER — Ambulatory Visit (INDEPENDENT_AMBULATORY_CARE_PROVIDER_SITE_OTHER): Payer: Medicaid Other

## 2021-09-16 DIAGNOSIS — Z3491 Encounter for supervision of normal pregnancy, unspecified, first trimester: Secondary | ICD-10-CM

## 2021-09-16 DIAGNOSIS — Z3200 Encounter for pregnancy test, result unknown: Secondary | ICD-10-CM

## 2021-10-05 ENCOUNTER — Encounter: Payer: Self-pay | Admitting: Certified Nurse Midwife

## 2021-10-10 ENCOUNTER — Ambulatory Visit (INDEPENDENT_AMBULATORY_CARE_PROVIDER_SITE_OTHER): Payer: Medicaid Other | Admitting: Obstetrics

## 2021-10-10 VITALS — Ht 64.0 in | Wt 255.0 lb

## 2021-10-10 DIAGNOSIS — Z113 Encounter for screening for infections with a predominantly sexual mode of transmission: Secondary | ICD-10-CM

## 2021-10-10 DIAGNOSIS — Z3481 Encounter for supervision of other normal pregnancy, first trimester: Secondary | ICD-10-CM

## 2021-10-10 DIAGNOSIS — Z3A11 11 weeks gestation of pregnancy: Secondary | ICD-10-CM

## 2021-10-10 DIAGNOSIS — O99210 Obesity complicating pregnancy, unspecified trimester: Secondary | ICD-10-CM

## 2021-10-10 DIAGNOSIS — E669 Obesity, unspecified: Secondary | ICD-10-CM

## 2021-10-10 DIAGNOSIS — O99211 Obesity complicating pregnancy, first trimester: Secondary | ICD-10-CM

## 2021-10-10 DIAGNOSIS — T7589XA Other specified effects of external causes, initial encounter: Secondary | ICD-10-CM
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
## 2021-10-10 NOTE — Patient Instructions (Signed)
First Trimester of Pregnancy  The first trimester of pregnancy starts on the first day of your last menstrual period until the end of week 12. This is also called months 1 through 3 of pregnancy. Body changes during your first trimester Your body goes through many changes during pregnancy. The changes usually return to normal after your baby is born. Physical changes You may gain or lose weight. Your breasts may grow larger and hurt. The area around your nipples may get darker. Dark spots or blotches may develop on your face. You may have changes in your hair. Health changes You may feel like you might vomit (nauseous), and you may vomit. You may have heartburn. You may have headaches. You may have trouble pooping (constipation). Your gums may bleed. Other changes You may get tired easily. You may pee (urinate) more often. Your menstrual periods will stop. You may not feel hungry. You may want to eat certain kinds of food. You may have changes in your emotions from day to day. You may have more dreams. Follow these instructions at home: Medicines Take over-the-counter and prescription medicines only as told by your doctor. Some medicines are not safe during pregnancy. Take a prenatal vitamin that contains at least 600 micrograms (mcg) of folic acid. Eating and drinking Eat healthy meals that include: Fresh fruits and vegetables. Whole grains. Good sources of protein, such as meat, eggs, or tofu. Low-fat dairy products. Avoid raw meat and unpasteurized juice, milk, and cheese. If you feel like you may vomit, or you vomit: Eat 4 or 5 small meals a day instead of 3 large meals. Try eating a few soda crackers. Drink liquids between meals instead of during meals. You may need to take these actions to prevent or treat trouble pooping: Drink enough fluids to keep your pee (urine) pale yellow. Eat foods that are high in fiber. These include beans, whole grains, and fresh fruits and  vegetables. Limit foods that are high in fat and sugar. These include fried or sweet foods. Activity Exercise only as told by your doctor. Most people can do their usual exercise routine during pregnancy. Stop exercising if you have cramps or pain in your lower belly (abdomen) or low back. Do not exercise if it is too hot or too humid, or if you are in a place of great height (high altitude). Avoid heavy lifting. If you choose to, you may have sex unless your doctor tells you not to. Relieving pain and discomfort Wear a good support bra if your breasts are sore. Rest with your legs raised (elevated) if you have leg cramps or low back pain. If you have bulging veins (varicose veins) in your legs: Wear support hose as told by your doctor. Raise your feet for 15 minutes, 3-4 times a day. Limit salt in your food. Safety Wear your seat belt at all times when you are in a car. Talk with your doctor if someone is hurting you or yelling at you. Talk with your doctor if you are feeling sad or have thoughts of hurting yourself. Lifestyle Do not use hot tubs, steam rooms, or saunas. Do not douche. Do not use tampons or scented sanitary pads. Do not use herbal medicines, illegal drugs, or medicines that are not approved by your doctor. Do not drink alcohol. Do not smoke or use any products that contain nicotine or tobacco. If you need help quitting, ask your doctor. Avoid cat litter boxes and soil that is used by cats. These carry  germs that can cause harm to the baby and can cause a loss of your baby by miscarriage or stillbirth. General instructions Keep all follow-up visits. This is important. Ask for help if you need counseling or if you need help with nutrition. Your doctor can give you advice or tell you where to go for help. Visit your dentist. At home, brush your teeth with a soft toothbrush. Floss gently. Write down your questions. Take them to your prenatal visits. Where to find more  information American Pregnancy Association: americanpregnancy.org [employer]: www.acog.org Office on Women's Health: KeywordPortfolios.com.br Contact a doctor if: You are dizzy. You have a fever. You have mild cramps or pressure in your lower belly. You have a nagging pain in your belly area. You continue to feel like you may vomit, you vomit, or you have watery poop (diarrhea) for 24 hours or longer. You have a bad-smelling fluid coming from your vagina. You have pain when you pee. You are exposed to a disease that spreads from person to person, such as chickenpox, measles, Zika virus, HIV, or hepatitis. Get help right away if: You have spotting or bleeding from your vagina. You have very bad belly cramping or pain. You have shortness of breath or chest pain. You have any kind of injury, such as from a fall or a car crash. You have new or increased pain, swelling, or redness in an arm or leg. Summary The first trimester of pregnancy starts on the first day of your last menstrual period until the end of week 12 (months 1 through 3). Eat 4 or 5 small meals a day instead of 3 large meals. Do not smoke or use any products that contain nicotine or tobacco. If you need help quitting, ask your doctor. Keep all follow-up visits. This information is not intended to replace advice given to you by your health care provider. Make sure you discuss any questions you have with your health care provider. Document Revised: 10/04/2019 Document Reviewed: 08/10/2019 Elsevier Patient Education  [place]. Prenatal Care Prenatal care is health care during pregnancy. It helps you and your unborn baby (fetus) stay as healthy as possible. Prenatal care may be provided by a midwife, a family practice doctor, a IT consultant (nurse practitioner or physician assistant), or a childbirth and pregnancy doctor (obstetrician). How does this affect me? During  pregnancy, you will be closely monitored for any new conditions that might develop. To lower your risk of pregnancy complications, you and your health care provider will talk about any underlying conditions you have. How does this affect my baby? Early and consistent prenatal care increases the chance that your baby will be healthy during pregnancy. Prenatal care lowers the risk that your baby will be: Born early (prematurely). Smaller than expected at birth (small for gestational age). What can I expect at the first prenatal care visit? Your first prenatal care visit will likely be the longest. You should schedule your first prenatal care visit as soon as you know that you are pregnant. Your first visit is a good time to talk about any questions or concerns you have about pregnancy. Medical history At your visit, you and your health care provider will talk about your medical history, including: Any past pregnancies. Your family's medical history. Medical history of the baby's father. Any long-term (chronic) health conditions you have and how you manage them. Any surgeries or procedures you have had. Any current over-the-counter or prescription medicines, herbs, or  supplements that you are taking. Other factors that could pose a risk to your baby, including: Exposure to harmful chemicals or radiation at work or at home. Any substance use, including tobacco, alcohol, and drug use. Your home setting and your stress levels, including: Exposure to abuse or violence. Household financial strain. Your daily health habits, including diet and exercise. Tests and screenings Your health care provider will: Measure your weight, height, and blood pressure. Do a physical exam, including a pelvic and breast exam. Perform blood tests and urine tests to check for: Urinary tract infection. Sexually transmitted infections (STIs). Low iron levels in your blood (anemia). Blood type and certain proteins on  red blood cells (Rh antibodies). Infections and immunity to viruses, such as hepatitis B and rubella. HIV (human immunodeficiency virus). Discuss your options for genetic screening. Tips about staying healthy Your health care provider will also give you information about how to keep yourself and your baby healthy, including: Nutrition and taking vitamins. Physical activity. How to manage pregnancy symptoms such as nausea and vomiting (morning sickness). Infections and substances that may be harmful to your baby and how to avoid them. Food safety. Dental care. Working. Travel. Warning signs to watch for and when to call your health care provider. How often will I have prenatal care visits? After your first prenatal care visit, you will have regular visits throughout your pregnancy. The visit schedule is often as follows: Up to week 28 of pregnancy: once every 4 weeks. 28-36 weeks: once every 2 weeks. After 36 weeks: every week until delivery. Some women may have visits more or less often depending on any underlying health conditions and the health of the baby. Keep all follow-up and prenatal care visits. This is important. What happens during routine prenatal care visits? Your health care provider will: Measure your weight and blood pressure. Check for fetal heart sounds. Measure the height of your uterus in your abdomen (fundal height). This may be measured starting around week 20 of pregnancy. Check the position of your baby inside your uterus. Ask questions about your diet, sleeping patterns, and whether you can feel the baby move. Review warning signs to watch for and signs of labor. Ask about any pregnancy symptoms you are having and how you are dealing with them. Symptoms may include: Headaches. Nausea and vomiting. Vaginal discharge. Swelling. Fatigue. Constipation. Changes in your vision. Feeling persistently sad or anxious. Any discomfort, including back or pelvic  pain. Bleeding or spotting. Make a list of questions to ask your health care provider at your routine visits. What tests might I have during prenatal care visits? You may have blood, urine, and imaging tests throughout your pregnancy, such as: Urine tests to check for glucose, protein, or signs of infection. Glucose tests to check for a form of diabetes that can develop during pregnancy (gestational diabetes mellitus). This is usually done around week 24 of pregnancy. Ultrasounds to check your baby's growth and development, to check for birth defects, and to check your baby's well-being. These can also help to decide when you should deliver your baby. A test to check for group B strep (GBS) infection. This is usually done around week 36 of pregnancy. Genetic testing. This may include blood, fluid, or tissue sampling, or imaging tests, such as an ultrasound. Some genetic tests are done during the first trimester and some are done during the second trimester. What else can I expect during prenatal care visits? Your health care provider may recommend getting certain vaccines  during pregnancy. These may include: A yearly flu shot (annual influenza vaccine). This is especially important if you will be pregnant during flu season. Tdap (tetanus, diphtheria, pertussis) vaccine. Getting this vaccine during pregnancy can protect your baby from whooping cough (pertussis) after birth. This vaccine may be recommended between weeks 27 and 36 of pregnancy. A COVID-19 vaccine. Later in your pregnancy, your health care provider may give you information about: Childbirth and breastfeeding classes. Choosing a health care provider for your baby. Umbilical cord banking. Breastfeeding. Birth control after your baby is born. The hospital labor and delivery unit and how to set up a tour. Registering at the hospital before you go into labor. Where to find more information Office on Women's Health:  LegalWarrants.gl American Pregnancy Association: americanpregnancy.org March of Dimes: marchofdimes.org Summary Prenatal care helps you and your baby stay as healthy as possible during pregnancy. Your first prenatal care visit will most likely be the longest. You will have visits and tests throughout your pregnancy to monitor your health and your baby's health. Bring a list of questions to your visits to ask your health care provider. Make sure to keep all follow-up and prenatal care visits. This information is not intended to replace advice given to you by your health care provider. Make sure you discuss any questions you have with your health care provider. Document Revised: 02/08/2020 Document Reviewed: 02/08/2020 Elsevier Patient Education  [place]. Common Medications Safe in Pregnancy  Acne:      Constipation:  Benzoyl Peroxide     Colace  Clindamycin      Dulcolax Suppository  Topica Erythromycin     Fibercon  Salicylic Acid      Metamucil         Miralax AVOID:        Senakot   Accutane    Cough:  Retin-A       Cough Drops  Tetracycline      Phenergan w/ Codeine if Rx  Minocycline      Robitussin (Plain & DM)  Antibiotics:     Crabs/Lice:  Ceclor       RID  Cephalosporins    AVOID:  E-Mycins      Kwell  Keflex  Macrobid/Macrodantin   Diarrhea:  Penicillin      Kao-Pectate  Zithromax      Imodium AD         PUSH FLUIDS AVOID:       Cipro     Fever:  Tetracycline      Tylenol (Regular or Extra  Minocycline       Strength)  Levaquin      Extra Strength-Do not          Exceed 8 tabs/24 hrs Caffeine:        <216m/day (equiv. To 1 cup of coffee or  approx. 3 12 oz sodas)         Gas: Cold/Hayfever:       Gas-X  Benadryl      Mylicon  Claritin       Phazyme  **Claritin-D        Chlor-Trimeton    Headaches:  Dimetapp      ASA-Free Excedrin  Drixoral-Non-Drowsy     Cold Compress  Mucinex (Guaifenasin)     Tylenol (Regular or Extra  Sudafed/Sudafed-12  Hour     Strength)  **Sudafed PE Pseudoephedrine   Tylenol Cold & Sinus     Vicks Vapor Rub  Zyrtec  **AVOID if Problems  With Blood Pressure         Heartburn: Avoid lying down for at least 1 hour after meals  Aciphex      Maalox     Rash:  Milk of Magnesia     Benadryl    Mylanta       1% Hydrocortisone Cream  Pepcid  Pepcid Complete   Sleep Aids:  Prevacid      Ambien   Prilosec       Benadryl  Rolaids       Chamomile Tea  Tums (Limit 4/day)     Unisom         Tylenol PM         Warm milk-add vanilla or  Hemorrhoids:       Sugar for taste  Anusol/Anusol H.C.  (RX: Analapram 2.5%)  Sugar Substitutes:  Hydrocortisone OTC     Ok in moderation  Preparation H      Tucks        Vaseline lotion applied to tissue with wiping    Herpes:     Throat:  Acyclovir      Oragel  Famvir  Valtrex     Vaccines:         Flu Shot Leg Cramps:       *Gardasil  Benadryl      Hepatitis A         Hepatitis B Nasal Spray:       Pneumovax  Saline Nasal Spray     Polio Booster         Tetanus Nausea:       Tuberculosis test or PPD  Vitamin B6 25 mg TID   AVOID:    Dramamine      *Gardasil  Emetrol       Live Poliovirus  Ginger Root 250 mg QID    MMR (measles, mumps &  High Complex Carbs @ Bedtime    rebella)  Sea Bands-Accupressure    Varicella (Chickenpox)  Unisom 1/2 tab TID     *No known complications           If received before Pain:         Known pregnancy;   Darvocet       Resume series after  Lortab        Delivery  Percocet    Yeast:   Tramadol      Femstat  Tylenol 3      Gyne-lotrimin  Ultram       Monistat  Vicodin           MISC:         All Sunscreens           Hair Coloring/highlights          Insect Repellant's          (Including DEET)         Mystic Tans

## 2021-10-21 ENCOUNTER — Encounter: Payer: Medicaid Other | Admitting: Certified Nurse Midwife

## 2021-11-03 ENCOUNTER — Ambulatory Visit (INDEPENDENT_AMBULATORY_CARE_PROVIDER_SITE_OTHER): Payer: Medicaid Other | Admitting: Obstetrics

## 2021-11-03 ENCOUNTER — Encounter: Payer: Self-pay | Admitting: Certified Nurse Midwife

## 2021-11-03 ENCOUNTER — Telehealth: Payer: Self-pay | Admitting: Certified Nurse Midwife

## 2021-11-03 ENCOUNTER — Encounter: Payer: Self-pay | Admitting: Obstetrics

## 2021-11-03 VITALS — BP 137/78 | HR 81 | Wt 266.3 lb

## 2021-11-03 DIAGNOSIS — O10919 Unspecified pre-existing hypertension complicating pregnancy, unspecified trimester: Secondary | ICD-10-CM | POA: Diagnosis not present

## 2021-11-03 DIAGNOSIS — F419 Anxiety disorder, unspecified: Secondary | ICD-10-CM | POA: Diagnosis not present

## 2021-11-03 DIAGNOSIS — Z3481 Encounter for supervision of other normal pregnancy, first trimester: Secondary | ICD-10-CM

## 2021-11-03 DIAGNOSIS — Z1379 Encounter for other screening for genetic and chromosomal anomalies: Secondary | ICD-10-CM | POA: Diagnosis not present

## 2021-11-03 DIAGNOSIS — Z113 Encounter for screening for infections with a predominantly sexual mode of transmission: Secondary | ICD-10-CM

## 2021-11-03 DIAGNOSIS — Z3A14 14 weeks gestation of pregnancy: Secondary | ICD-10-CM

## 2021-11-03 DIAGNOSIS — Z3A11 11 weeks gestation of pregnancy: Secondary | ICD-10-CM

## 2021-11-03 LAB — POCT URINALYSIS DIPSTICK OB
Bilirubin, UA: NEGATIVE
Blood, UA: NEGATIVE
Glucose, UA: NEGATIVE
Ketones, UA: NEGATIVE
Leukocytes, UA: NEGATIVE
Nitrite, UA: NEGATIVE
POC,PROTEIN,UA: NEGATIVE
Spec Grav, UA: 1.015 (ref 1.010–1.025)
Urobilinogen, UA: 0.2 E.U./dL
pH, UA: 6 (ref 5.0–8.0)

## 2021-11-03 MED ORDER — ASPIRIN 81 MG PO TBEC: 162.0000 mg | DELAYED_RELEASE_TABLET | Freq: Every day | ORAL | 12 refills | Status: DC

## 2021-11-04 LAB — COMPREHENSIVE METABOLIC PANEL
ALT: 8 IU/L (ref 0–32)
AST: 9 IU/L (ref 0–40)
Albumin/Globulin Ratio: 1.6 (ref 1.2–2.2)
Albumin: 4 g/dL (ref 3.9–5.0)
Alkaline Phosphatase: 60 IU/L (ref 42–106)
BUN/Creatinine Ratio: 15 (ref 9–23)
BUN: 8 mg/dL (ref 6–20)
Bilirubin Total: <0.2 mg/dL (ref 0.0–1.2)
CO2: 19 mmol/L — ABNORMAL LOW (ref 20–29)
Calcium: 9.1 mg/dL (ref 8.7–10.2)
Chloride: 103 mmol/L (ref 96–106)
Creatinine, Ser: 0.55 mg/dL — ABNORMAL LOW (ref 0.57–1.00)
Globulin, Total: 2.5 g/dL (ref 1.5–4.5)
Glucose: 84 mg/dL (ref 70–99)
Potassium: 4.3 mmol/L (ref 3.5–5.2)
Sodium: 139 mmol/L (ref 134–144)
Total Protein: 6.5 g/dL (ref 6.0–8.5)
eGFR: 134 mL/min/{1.73_m2} (ref >59)

## 2021-11-04 LAB — CBC
Hematocrit: 34.2 % (ref 34.0–46.6)
Hemoglobin: 11.2 g/dL (ref 11.1–15.9)
MCH: 27 pg (ref 26.6–33.0)
MCHC: 32.7 g/dL (ref 31.5–35.7)
MCV: 82 fL (ref 79–97)
Platelets: 255 10*3/uL (ref 150–450)
RBC: 4.15 x10E6/uL (ref 3.77–5.28)
RDW: 14.2 % (ref 11.7–15.4)
WBC: 8.7 10*3/uL (ref 3.4–10.8)

## 2021-11-04 LAB — PROTEIN / CREATININE RATIO, URINE
Creatinine, Urine: 70.8 mg/dL
Protein, Ur: 9.6 mg/dL
Protein/Creat Ratio: 136 mg/g creat (ref 0–200)

## 2021-11-06 ENCOUNTER — Other Ambulatory Visit: Payer: Medicaid Other

## 2021-11-06 DIAGNOSIS — Z3A15 15 weeks gestation of pregnancy: Secondary | ICD-10-CM

## 2021-11-06 DIAGNOSIS — O99210 Obesity complicating pregnancy, unspecified trimester: Secondary | ICD-10-CM

## 2021-11-06 DIAGNOSIS — Z3A11 11 weeks gestation of pregnancy: Secondary | ICD-10-CM

## 2021-11-06 DIAGNOSIS — Z3481 Encounter for supervision of other normal pregnancy, first trimester: Secondary | ICD-10-CM

## 2021-11-06 DIAGNOSIS — T7589XA Other specified effects of external causes, initial encounter: Secondary | ICD-10-CM

## 2021-11-06 DIAGNOSIS — Z113 Encounter for screening for infections with a predominantly sexual mode of transmission: Secondary | ICD-10-CM

## 2021-11-07 ENCOUNTER — Emergency Department: Payer: Medicaid Other

## 2021-11-07 ENCOUNTER — Telehealth: Payer: Self-pay | Admitting: Certified Nurse Midwife

## 2021-11-07 ENCOUNTER — Emergency Department
Admission: EM | Admit: 2021-11-07 | Discharge: 2021-11-08 | Disposition: A | Discharge status: Home / Self Care | Payer: Medicaid Other | Attending: Emergency Medicine | Admitting: Emergency Medicine

## 2021-11-07 ENCOUNTER — Other Ambulatory Visit: Payer: Self-pay

## 2021-11-07 DIAGNOSIS — R1030 Lower abdominal pain, unspecified: Secondary | ICD-10-CM | POA: Insufficient documentation

## 2021-11-07 DIAGNOSIS — Z79899 Other long term (current) drug therapy: Secondary | ICD-10-CM | POA: Diagnosis not present

## 2021-11-07 DIAGNOSIS — I10 Essential (primary) hypertension: Secondary | ICD-10-CM | POA: Diagnosis not present

## 2021-11-07 DIAGNOSIS — R42 Dizziness and giddiness: Secondary | ICD-10-CM | POA: Insufficient documentation

## 2021-11-07 DIAGNOSIS — R0602 Shortness of breath: Secondary | ICD-10-CM | POA: Insufficient documentation

## 2021-11-07 DIAGNOSIS — J45909 Unspecified asthma, uncomplicated: Secondary | ICD-10-CM | POA: Insufficient documentation

## 2021-11-07 DIAGNOSIS — R0789 Other chest pain: Secondary | ICD-10-CM | POA: Insufficient documentation

## 2021-11-07 LAB — BASIC METABOLIC PANEL
Anion gap: 8 (ref 5–15)
BUN: 8 mg/dL (ref 6–20)
CO2: 21 mmol/L — ABNORMAL LOW (ref 22–32)
Calcium: 9.1 mg/dL (ref 8.9–10.3)
Chloride: 106 mmol/L (ref 98–111)
Creatinine, Ser: 0.53 mg/dL (ref 0.44–1.00)
GFR, Estimated: >60 mL/min (ref >60)
Glucose, Bld: 89 mg/dL (ref 70–99)
Potassium: 3.9 mmol/L (ref 3.5–5.1)
Sodium: 135 mmol/L (ref 135–145)

## 2021-11-07 LAB — URINALYSIS, ROUTINE W REFLEX MICROSCOPIC
Bilirubin Urine: NEGATIVE
Bilirubin, UA: NEGATIVE
Glucose, UA: NEGATIVE
Glucose, UA: NEGATIVE mg/dL
Hgb urine dipstick: NEGATIVE
Ketones, UA: NEGATIVE
Ketones, ur: NEGATIVE mg/dL
Leukocytes,UA: NEGATIVE
Leukocytes,Ua: NEGATIVE
Nitrite, UA: NEGATIVE
Nitrite: NEGATIVE
Protein, ur: NEGATIVE mg/dL
RBC, UA: NEGATIVE
Specific Gravity, UA: 1.02 (ref 1.005–1.030)
Specific Gravity, Urine: 1.017 (ref 1.005–1.030)
Urobilinogen, Ur: 0.2 mg/dL (ref 0.2–1.0)
pH, UA: 7.5 (ref 5.0–7.5)
pH: 6 (ref 5.0–8.0)

## 2021-11-07 LAB — HEMOGLOBIN A1C
Est. average glucose Bld gHb Est-mCnc: 100 mg/dL
Hgb A1c MFr Bld: 5.1 % (ref 4.8–5.6)

## 2021-11-07 LAB — TROPONIN I (HIGH SENSITIVITY): Troponin I (High Sensitivity): 4 ng/L (ref <18)

## 2021-11-07 LAB — CBC
HCT: 35.9 % — ABNORMAL LOW (ref 36.0–46.0)
Hemoglobin: 11.2 g/dL — ABNORMAL LOW (ref 12.0–15.0)
MCH: 26.4 pg (ref 26.0–34.0)
MCHC: 31.2 g/dL (ref 30.0–36.0)
MCV: 84.5 fL (ref 80.0–100.0)
Platelets: 286 10*3/uL (ref 150–400)
RBC: 4.25 MIL/uL (ref 3.87–5.11)
RDW: 13.9 % (ref 11.5–15.5)
WBC: 11.6 10*3/uL — ABNORMAL HIGH (ref 4.0–10.5)
nRBC: 0 % (ref 0.0–0.2)

## 2021-11-07 LAB — HCV INTERPRETATION

## 2021-11-07 LAB — VIRAL HEPATITIS HBV, HCV
HCV Ab: NONREACTIVE
Hep B Core Total Ab: NEGATIVE
Hep B Surface Ab, Qual: NONREACTIVE
Hepatitis B Surface Ag: NEGATIVE

## 2021-11-07 LAB — HIV ANTIBODY (ROUTINE TESTING W REFLEX): HIV Screen 4th Generation wRfx: NONREACTIVE

## 2021-11-07 LAB — BRAIN NATRIURETIC PEPTIDE: B Natriuretic Peptide: 60 pg/mL (ref 0.0–100.0)

## 2021-11-07 LAB — ANTIBODY SCREEN: Antibody Screen: NEGATIVE

## 2021-11-07 LAB — ABO AND RH: Rh Factor: POSITIVE

## 2021-11-07 LAB — RPR: RPR Ser Ql: NONREACTIVE

## 2021-11-07 LAB — RUBELLA SCREEN: Rubella Antibodies, IGG: 4.34 index (ref >0.99)

## 2021-11-07 LAB — TOXOPLASMA ANTIBODIES- IGG AND  IGM
Toxoplasma Antibody- IgM: <3 AU/mL (ref 0.0–7.9)
Toxoplasma IgG Ratio: <3 IU/mL (ref 0.0–7.1)

## 2021-11-07 LAB — VARICELLA ZOSTER ANTIBODY, IGG: Varicella zoster IgG: <135 index — ABNORMAL LOW (ref >165)

## 2021-11-07 LAB — PREGNANCY, URINE: Preg Test, Ur: POSITIVE — AB

## 2021-11-07 LAB — D-DIMER, QUANTITATIVE: D-Dimer, Quant: 0.39 ug/mL-FEU (ref 0.00–0.50)

## 2021-11-07 NOTE — Discharge Instructions (Addendum)
Continue with your home meds. Your labs and chest XR normal at this time.

## 2021-11-07 NOTE — ED Provider Notes (Signed)
[facility] Provider Note    Event Date/Time   First MD Initiated Contact with Patient 11/07/21 1823     (approximate)   History   No chief complaint on file.   HPI  Joyce Smith is a 21 y.o. female  G2P0010 at approximately 15 weeks with history of hypertension on labetalol 100 mg twice daily and asthma who presents for evaluation of an episode of shortness of breath and tightness in her left shoulder that occurred earlier today.  Patient states she felt overheated.  She states she has been using her inhaler for some mild shortness of breath of the last couple days but does not feel this is helped much.  She is also on Singulair.  She denies any cough, fevers, headache, earache, sore throat, vomiting diarrhea or urinary symptoms.  She reports some intermittent mild lower abdominal discomfort that is 90 different today than usual.  No recent trauma or injuries.  No tobacco use EtOH use or illicit drug use.  She states that the dizziness she had earlier today which experienced when she felt very hot is resolved but she still feels slightly short of breath.  He does not smoke.    Past Medical History:  Diagnosis Date   Asthma    Elevated blood pressure reading    Seasonal allergies      Physical Exam  Triage Vital Signs: ED Triage Vitals  Enc Vitals Group     BP 11/07/21 1606 (!) 148/88     Pulse Rate 11/07/21 1606 87     Resp 11/07/21 1606 18     Temp 11/07/21 1606 98.4 F (36.9 C)     Temp Source 11/07/21 1606 Oral     SpO2 11/07/21 1606 98 %     Weight 11/07/21 1607 266 lb 4.8 oz (120.8 kg)     Height 11/07/21 1607 5\' 5"  (1.651 m)     Head Circumference --      Peak Flow --      Pain Score 11/07/21 1607 0     Pain Loc --      Pain Edu? --      Excl. in GC? --     Most recent vital signs: Vitals:   11/07/21 1606  BP: (!) 148/88  Pulse: 87  Resp: 18  Temp: 98.4 F (36.9 C)  SpO2: 98%    General: Awake, no distress.  CV:  Good  peripheral perfusion.  2+ radial pulses. Resp:  Normal effort.  Clear but slightly diminished bilaterally.  No wheezing rhonchi or rales. Abd:  No distention.  Gravid but soft throughout. Other:  Mild lower extremity edema.   ED Results / Procedures / Treatments  Labs (all labs ordered are listed, but only abnormal results are displayed) Labs Reviewed  BASIC METABOLIC PANEL - Abnormal; Notable for the following components:      Result Value   CO2 21 (*)    All other components within normal limits  CBC - Abnormal; Notable for the following components:   WBC 11.6 (*)    Hemoglobin 11.2 (*)    HCT 35.9 (*)    All other components within normal limits  URINALYSIS, ROUTINE W REFLEX MICROSCOPIC - Abnormal; Notable for the following components:   Color, Urine STRAW (*)    APPearance CLEAR (*)    All other components within normal limits  PREGNANCY, URINE - Abnormal; Notable for the following components:   Preg Test, Ur POSITIVE (*)  All other components within normal limits  BRAIN NATRIURETIC PEPTIDE  D-DIMER, QUANTITATIVE (NOT AT [facility])  CBG MONITORING, ED  TROPONIN I (HIGH SENSITIVITY)     EKG  EKG is remarkable for sinus rhythm with a ventricular rate of 75, normal axis, unremarkable intervals without clear evidence of acute ischemia or significant arrhythmia.   RADIOLOGY    PROCEDURES:  Critical Care performed: No  Procedures    MEDICATIONS ORDERED IN ED: Medications - No data to display   IMPRESSION / MDM / ASSESSMENT AND PLAN / ED COURSE  I reviewed the triage vital signs and the nursing notes. Patient's presentation is most consistent with acute presentation with potential threat to life or bodily function.                               Differential diagnosis includes, but is not limited to decreased pulmonary reserve from some compressive atelectasis and elevation of the diaphragm, asthma, bronchitis, pneumonia, anemia, arrhythmia, metabolic derangements  and a PE.  We will also check a troponin to assess for any evidence of cardiac ischemia.  EKG is remarkable for sinus rhythm with a ventricular rate of 75, normal axis, unremarkable intervals without clear evidence of acute ischemia or significant arrhythmia.  Nonelevated troponin to greater than 3 hours after symptom onset is not suggestive of ACS.  BMP shows no significant electrolyte or metabolic derangements.  CBC shows WBC count of 11.6, 8.74 days ago with stable hemoglobin 11.2 compared to 11.24 days ago and normal platelets.  Pregnancy test is positive.  UA does not appear infected.  BNP is double digits and not suggestive of acute systolic heart failure.  Care patient signed over to sending provider approximately 8:30 PM with plan to follow-up D-dimer and if elevated obtain a CTA and if nonelevated the chest x-ray.  Pending the results if they are unremarkable patient can likely be safely discharged with outpatient PCP and OB follow-up.     FINAL CLINICAL IMPRESSION(S) / ED DIAGNOSES   Final diagnoses:  SOB (shortness of breath)  Chest tightness     Rx / DC Orders   ED Discharge Orders     None        Note:  This document was prepared using Dragon voice recognition software and may include unintentional dictation errors.   Gilles Chiquito, MD 11/07/21 2019

## 2021-11-07 NOTE — ED Provider Triage Note (Signed)
Emergency Medicine Provider Triage Evaluation Note  Joyce Smith , a 21 y.o. female  was evaluated in triage.  Pt complains of near syncope while at work. She states she got really hot and nearly passed out. She is [redacted] weeks pregnant. No vaginal bleeding or pelvic pain.  Physical Exam  BP (!) 148/88 (BP Location: Right Arm)   Pulse 87   Temp 98.4 F (36.9 C) (Oral)   Resp 18   Ht 5\' 5"  (1.651 m)   Wt 120.8 kg   LMP 07/22/2021 (Exact Date)   SpO2 98%   BMI 44.31 kg/m  Gen:   Awake, no distress   Resp:  Normal effort  MSK:   Moves extremities without difficulty  Other:    Medical Decision Making  Medically screening exam initiated at 4:12 PM.  Appropriate orders placed.  Joyce Smith was informed that the remainder of the evaluation will be completed by another provider, this initial triage assessment does not replace that evaluation, and the importance of remaining in the ED until their evaluation is complete.    Karolee Stamps, FNP 11/07/21 [phone]

## 2021-11-07 NOTE — Telephone Encounter (Signed)
Pt called stated  " she left work early due to dizziness and shortness of breath".Consulted CMA and advised patient to go to ED for evaluation. Pt verbalized understanding and will call office on 07/05 with an update.

## 2021-11-07 NOTE — ED Triage Notes (Signed)
Pt to ED via POV from home. Pt reports she was at work and got hot and had a near syncopal episode. Pt reports tightness in left shoulder and SOB. Pt also states she has had no energy and feels like her iron is low. Pt reports pregnancy induced HTN. Pt [redacted]wks pregnant.   Pt denies vaginal bleeding or abnormal pain. Pt sees Joyce Smith and [facility].

## 2021-11-08 ENCOUNTER — Encounter: Payer: Self-pay | Admitting: Certified Nurse Midwife

## 2021-11-08 LAB — MATERNIT21  PLUS CORE+ESS+SCA, BLOOD

## 2021-11-08 LAB — PAIN MGT SCRN (14 DRUGS), UR
Amphetamine Scrn, Ur: NEGATIVE ng/mL
BARBITURATE SCREEN URINE: NEGATIVE ng/mL
BENZODIAZEPINE SCREEN, URINE: NEGATIVE ng/mL
Buprenorphine, Urine: NEGATIVE ng/mL
CANNABINOIDS UR QL SCN: POSITIVE ng/mL — AB
Cocaine (Metab) Scrn, Ur: NEGATIVE ng/mL
Creatinine(Crt), U: 184 mg/dL (ref 20.0–300.0)
Fentanyl, Urine: NEGATIVE pg/mL
Meperidine Screen, Urine: NEGATIVE ng/mL
Methadone Screen, Urine: NEGATIVE ng/mL
OXYCODONE+OXYMORPHONE UR QL SCN: NEGATIVE ng/mL
Opiate Scrn, Ur: NEGATIVE ng/mL
Ph of Urine: 7.5 (ref 4.5–8.9)
Phencyclidine Qn, Ur: NEGATIVE ng/mL
Propoxyphene Scrn, Ur: NEGATIVE ng/mL
Tramadol Screen, Urine: NEGATIVE ng/mL

## 2021-11-08 LAB — NICOTINE SCREEN, URINE: Cotinine Ql Scrn, Ur: NEGATIVE ng/mL

## 2021-11-09 LAB — GC/CHLAMYDIA PROBE AMP
Chlamydia trachomatis, NAA: NEGATIVE
Neisseria Gonorrhoeae by PCR: NEGATIVE

## 2021-11-12 LAB — URINE CULTURE, OB REFLEX

## 2021-11-12 LAB — CULTURE, OB URINE

## 2021-11-27 ENCOUNTER — Encounter: Payer: Self-pay | Admitting: Certified Nurse Midwife

## 2021-12-02 ENCOUNTER — Telehealth: Payer: Self-pay | Admitting: Certified Nurse Midwife

## 2021-12-02 ENCOUNTER — Ambulatory Visit
Admission: RE | Admit: 2021-12-02 | Discharge: 2021-12-02 | Disposition: A | Discharge status: Home / Self Care | Payer: Medicaid Other | Source: Ambulatory Visit | Attending: Obstetrics | Admitting: Obstetrics

## 2021-12-02 DIAGNOSIS — Z3492 Encounter for supervision of normal pregnancy, unspecified, second trimester: Secondary | ICD-10-CM | POA: Insufficient documentation

## 2021-12-02 DIAGNOSIS — Z3A14 14 weeks gestation of pregnancy: Secondary | ICD-10-CM | POA: Diagnosis present

## 2021-12-03 ENCOUNTER — Ambulatory Visit (INDEPENDENT_AMBULATORY_CARE_PROVIDER_SITE_OTHER): Payer: Medicaid Other | Admitting: Certified Nurse Midwife

## 2021-12-03 VITALS — BP 124/79 | HR 87 | Wt 269.0 lb

## 2021-12-03 DIAGNOSIS — O99212 Obesity complicating pregnancy, second trimester: Secondary | ICD-10-CM

## 2021-12-03 DIAGNOSIS — Z3A19 19 weeks gestation of pregnancy: Secondary | ICD-10-CM

## 2021-12-03 LAB — POCT URINALYSIS DIPSTICK OB
Bilirubin, UA: NEGATIVE
Blood, UA: NEGATIVE
Glucose, UA: NEGATIVE
Ketones, UA: NEGATIVE
Leukocytes, UA: NEGATIVE
Nitrite, UA: NEGATIVE
POC,PROTEIN,UA: NEGATIVE
Urobilinogen, UA: 0.2 E.U./dL
pH, UA: 5 (ref 5.0–8.0)

## 2021-12-03 NOTE — Progress Notes (Signed)
ROB doing well. No concerns . States her asthma has improved since her visit to ED. Reviewed u/s resulted. Discussed need to repeat due to not getting all of the pictures. Orders placed. Discussed anesthesia consult , orders placed. Follow up 2 wks for completion of u/s and ROB with missy 4-5 weeks.  Body mass index is 44.76 kg/m.  CLINICAL DATA:  Evaluate fetal age and anatomy  EXAM: OBSTETRICAL ULTRASOUND >14 WKS  COMPARISON:  09/16/2021.  FINDINGS: Number of Fetuses: 1  Heart Rate:  157 bpm  Movement: Seen  Presentation: Cephalic  Previa: No  Placental Location: Anterior  Amniotic Fluid (Subjective): Normal  Amniotic Fluid (Objective):  Vertical pocket = 5.3cm  FETAL BIOMETRY  BPD: 4.11cm 18w 3 d  HC:   15.21cm 18w 2d  AC:   13.06cm 18w 4d  FL:   2.84cm 18w 5d  Current Mean GA: 18w 2d [place] EDC: 05/03/2022  Assigned GA:  19w 0d Assigned EDC: 04/28/2022  Estimated Fetal Weight:  248 +/- 37 g  FETAL ANATOMY  Lateral Ventricles: Appears normal  Thalami/CSP: Appears normal  Posterior Fossa:  Appears normal  Nuchal Region: Appears normal NFT= 3.2 mm  Upper Lip: Appears normal  Spine: Appears normal  4 Chamber Heart on Left: Appears normal  LVOT: Appears normal  RVOT: Not optimally visualized  Stomach on Left: Appears normal  3 Vessel Cord: Appears normal  Cord Insertion site: Appears normal  Kidneys: Not optimally visualized  Bladder: Appears normal  Extremities: Appear normal  Sex: Female  Technically difficult due to: Fetal position during the study  Maternal Findings:  Cervix:  Cervix is closed measuring 4.1 cm  IMPRESSION: Single live intrauterine pregnancy is seen. Sonographically estimated gestational age is 18 weeks 2 days. There has been satisfactory interval growth.  Fetal RVOT and kidneys are not optimally visualized due to fetal position during the study. Short-term follow-up  limited OB sonogram may be considered to re-evaluate these structures. Fetal anatomic survey is otherwise unremarkable.   Electronically Signed   By: Ernie Avena M.D.   On: 12/02/2021 15:55

## 2021-12-03 NOTE — Progress Notes (Signed)
ROB- feels dizzy when lays flat on back x 1 week

## 2021-12-03 NOTE — Patient Instructions (Signed)
Round Ligament Pain  The round ligaments are a pair of cord-like tissues that help support the uterus. They can become a source of pain during pregnancy as the ligaments soften and stretch as the baby grows. The pain usually begins in the second trimester (13-28 weeks) of pregnancy, and should only last for a few seconds when it occurs. However, the pain can come and go until the baby is delivered. The pain does not cause harm to the baby. Round ligament pain is usually a short, sharp, and pinching pain, but it can also be a dull, lingering, and aching pain. The pain is felt in the lower side of the abdomen or in the groin. It usually starts deep in the groin and moves up to the outside of the hip area. The pain may happen when you: Suddenly change position, such as quickly going from a sitting to standing position. Do physical activity. Cough or sneeze. Follow these instructions at home: Managing pain  When the pain starts, relax. Then, try any of these methods to help with the pain: Sit down. Flex your knees up to your abdomen. Lie on your side with one pillow under your abdomen and another pillow between your legs. Sit in a warm bath for 15-20 minutes or until the pain goes away. General instructions Watch your condition for any changes. Move slowly when you sit down or stand up. Stop or reduce your physical activities if they cause pain. Avoid long walks if they cause pain. Take over-the-counter and prescription medicines only as told by your health care provider. Keep all follow-up visits. This is important. Contact a health care provider if: Your pain does not go away with treatment. You feel pain in your back that you did not have before. Your medicine is not helping. You have a fever or chills. You have nausea or vomiting. You have diarrhea. You have pain when you urinate. Get help right away if: You have pain that is a rhythmic, cramping pain similar to labor pains. Labor  pains are usually 2 minutes apart, last for about 1 minute, and involve a bearing down feeling or pressure in your pelvis. You have vaginal bleeding. These symptoms may represent a serious problem that is an emergency. Do not wait to see if the symptoms will go away. Get medical help right away. Call your local emergency services (911 in the [place] Do not drive yourself to the hospital. Summary Round ligament pain is felt in the lower abdomen or groin. This pain usually begins in the second trimester (13-28 weeks) and should only last for a few seconds when it occurs. You may notice the pain when you suddenly change position, when you cough or sneeze, or during physical activity. Relaxing, flexing your knees to your abdomen, lying on one side, or taking a warm bath may help to get rid of the pain. Contact your health care provider if the pain does not go away. This information is not intended to replace advice given to you by your health care provider. Make sure you discuss any questions you have with your health care provider. Document Revised: 07/10/2020 Document Reviewed: 07/10/2020 Elsevier Patient Education  2023 [employer]vier Inc.  

## 2021-12-03 NOTE — Telephone Encounter (Signed)
Made in error

## 2021-12-03 NOTE — Addendum Note (Signed)
Addended by: Donnetta Hail on: 12/03/2021 11:45 AM   Modules accepted: Orders

## 2021-12-11 ENCOUNTER — Encounter: Payer: Self-pay | Admitting: Certified Nurse Midwife

## 2021-12-16 ENCOUNTER — Ambulatory Visit (INDEPENDENT_AMBULATORY_CARE_PROVIDER_SITE_OTHER): Payer: Medicaid Other

## 2021-12-16 DIAGNOSIS — Z3402 Encounter for supervision of normal first pregnancy, second trimester: Secondary | ICD-10-CM

## 2021-12-16 DIAGNOSIS — Z3A19 19 weeks gestation of pregnancy: Secondary | ICD-10-CM

## 2021-12-23 ENCOUNTER — Encounter
Admission: RE | Admit: 2021-12-23 | Discharge: 2021-12-23 | Disposition: A | Discharge status: Home / Self Care | Payer: Medicaid Other | Source: Ambulatory Visit | Attending: Anesthesiology | Admitting: Anesthesiology

## 2021-12-23 NOTE — Consult Note (Signed)
[facility] Anesthesia Consultation  CUBA NATARAJAN SAY:[id number] DOB: [birth year] DOA: 12/23/2021 PCP: Patient, No Pcp Per   Requesting midwife: Doreene Burke Date of consultation: 12/23/2021 Reason for consultation: Obesity during pregnancy  CHIEF COMPLAINT:  Obesity during pregnancy  HISTORY OF PRESENT ILLNESS: Landrey Mahurin  is a 21 y.o. female with a known history of G1P000 at 21 weeks 6 days with PMH of asthma. Patient states that since she has gotten pregnant she has had to increase use of her albuterol inhaler to once a day. She states she feels like it is seasonal. Discussed following up with her primary care doctor about her asthma if it gets worse.   PAST MEDICAL HISTORY:   Past Medical History:  Diagnosis Date   Asthma    Elevated blood pressure reading    Seasonal allergies     PAST SURGICAL HISTORY:  Past Surgical History:  Procedure Laterality Date   NONE      SOCIAL HISTORY:  Social History   Tobacco Use   Smoking status: Never   Smokeless tobacco: Never  Substance Use Topics   Alcohol use: No    FAMILY HISTORY:  Family History  Problem Relation Age of Onset   Asthma Mother    Hypertension Mother    Thyroid disease Mother    Stroke Maternal Grandmother    Cancer Paternal Grandmother        BREAST and Skin Cancer   Diabetes Paternal Grandmother    Hypertension Paternal Grandmother    Rashes / Skin problems Paternal Grandmother    Stroke Paternal Grandmother     DRUG ALLERGIES: No Known Allergies  REVIEW OF SYSTEMS:   RESPIRATORY: No cough, shortness of breath, wheezing.  CARDIOVASCULAR: No chest pain, orthopnea, edema.  HEMATOLOGY: No anemia, easy bruising or bleeding SKIN: No rash or lesion. NEUROLOGIC: No tingling, numbness, weakness.  PSYCHIATRY: No anxiety or depression.   MEDICATIONS AT HOME:  Prior to Admission medications   Medication Sig Start Date End Date Taking? Authorizing  Provider  albuterol (VENTOLIN HFA) 108 (90 Base) MCG/ACT inhaler Inhale 2 puffs into the lungs every 6 (six) hours as needed for wheezing or shortness of breath. 07/24/20   Doreene Burke, CNM  aspirin EC 81 MG tablet Take 2 tablets (162 mg total) by mouth daily. Swallow whole. 11/03/21   Glenetta Borg, CNM  labetalol (NORMODYNE) 100 MG tablet Take 1 tablet (100 mg total) by mouth 2 (two) times daily. 05/26/21   Doreene Burke, CNM  montelukast (SINGULAIR) 10 MG tablet TAKE 1 TABLET BY MOUTH EVERYDAY AT BEDTIME 10/14/20   Doreene Burke, CNM  Prenatal Vit-Fe Fumarate-FA (MULTIVITAMIN-PRENATAL) 27-0.8 MG TABS tablet Take 1 tablet by mouth daily at 12 noon.    [provider]  Probiotic Product (PROBIOTIC PO) Take by mouth.    [provider]      PHYSICAL EXAMINATION:   VITAL SIGNS: Last menstrual period 07/22/2021.  GENERAL:  21 y.o.-year-old patient no acute distress.  HEENT: Head atraumatic, normocephalic. Oropharynx and nasopharynx clear. MP 3, TM distance >3 cm, normal mouth opening. LUNGS: No use of accessory muscles of respiration.   EXTREMITIES: No pedal edema, cyanosis, or clubbing.  NEUROLOGIC: normal gait PSYCHIATRIC: The patient is alert and oriented x 3.  SKIN: No obvious rash, lesion, or ulcer.    IMPRESSION AND PLAN:   Denetria Luevanos  is a 21 y.o. female presenting with obesity during pregnancy. BMI is currently 44.76 at [redacted] weeks gestation.   We discussed  analgesic options during labor including epidural analgesia. Discussed that in obesity there can be increased difficulty with epidural placement or even failure of successful epidural. We also discussed that even after successful epidural placement there is increased risk of catheter migration out of the epidural space that would require catheter replacement. Discussed use of epidural vs spinal vs GA if cesarean delivery is required. Discussed increased risk of difficult intubation during pregnancy  should an emergency cesarean delivery be required.   Patient is appropriate for care and delivery at [facility]

## 2022-01-07 DIAGNOSIS — Z3490 Encounter for supervision of normal pregnancy, unspecified, unspecified trimester: Secondary | ICD-10-CM | POA: Insufficient documentation

## 2022-01-08 ENCOUNTER — Encounter: Payer: Self-pay | Admitting: Obstetrics

## 2022-01-08 ENCOUNTER — Ambulatory Visit (INDEPENDENT_AMBULATORY_CARE_PROVIDER_SITE_OTHER): Payer: Medicaid Other | Admitting: Obstetrics

## 2022-01-08 DIAGNOSIS — Z3492 Encounter for supervision of normal pregnancy, unspecified, second trimester: Secondary | ICD-10-CM

## 2022-01-08 NOTE — Progress Notes (Signed)
ROB at [redacted]w[redacted]d. Starting to feel fetal movements. Reviewed kick counts. Denies cramping, bleeding, LOF. Joyce Smith is using her inhaler at least twice a day. Since she has not been able to establish a PCP, recommend seeing MD for next visit to discuss asthma management. She is taking ASA daily. Anesthesiology consult completed. Encouraged CBE. Discussed glucose test at next visit and instructions given. RTC in 4 weeks.  Guadlupe Spanish, CNM

## 2022-02-02 NOTE — Progress Notes (Deleted)
    GYNECOLOGY PROGRESS NOTE  Subjective:    Patient ID: Joyce Smith, female    DOB: 2000-10-04, 21 y.o.   MRN: [id number]  HPI  Patient is a 21 y.o. G4P0010 female who presents to discuss asthma management. Patient is [redacted]w[redacted]d, she does not have a PCP and she needs to be evaluated for asthma. She is using her Albuterol inhaler twice daily.  {Common ambulatory SmartLinks:19316}  Review of Systems {ros; complete:30496}   Objective:   Last menstrual period 07/22/2021. There is no height or weight on file to calculate BMI. General appearance: {general exam:16600} Abdomen: {abdominal exam:16834} Pelvic: {pelvic exam:16852::"cervix normal in appearance","external genitalia normal","no adnexal masses or tenderness","no cervical motion tenderness","rectovaginal septum normal","uterus normal size, shape, and consistency","vagina normal without discharge"} Extremities: {extremity exam:5109} Neurologic: {neuro exam:17854}   Assessment:   No diagnosis found.   Plan:   There are no diagnoses linked to this encounter.

## 2022-02-03 ENCOUNTER — Ambulatory Visit (INDEPENDENT_AMBULATORY_CARE_PROVIDER_SITE_OTHER): Payer: Medicaid Other | Admitting: Obstetrics and Gynecology

## 2022-02-03 ENCOUNTER — Other Ambulatory Visit: Payer: Medicaid Other

## 2022-02-03 ENCOUNTER — Encounter: Payer: Self-pay | Admitting: Obstetrics and Gynecology

## 2022-02-03 VITALS — BP 127/64 | HR 91 | Wt 283.8 lb

## 2022-02-03 DIAGNOSIS — Z113 Encounter for screening for infections with a predominantly sexual mode of transmission: Secondary | ICD-10-CM

## 2022-02-03 DIAGNOSIS — Z23 Encounter for immunization: Secondary | ICD-10-CM | POA: Diagnosis not present

## 2022-02-03 DIAGNOSIS — Z3492 Encounter for supervision of normal pregnancy, unspecified, second trimester: Secondary | ICD-10-CM

## 2022-02-03 DIAGNOSIS — Z131 Encounter for screening for diabetes mellitus: Secondary | ICD-10-CM

## 2022-02-03 DIAGNOSIS — Z3A28 28 weeks gestation of pregnancy: Secondary | ICD-10-CM

## 2022-02-03 DIAGNOSIS — Z130 Encounter for screening for diseases of the blood and blood-forming organs and certain disorders involving the immune mechanism: Secondary | ICD-10-CM

## 2022-02-03 LAB — POCT URINALYSIS DIPSTICK OB
Bilirubin, UA: NEGATIVE
Blood, UA: NEGATIVE
Glucose, UA: NEGATIVE
Ketones, UA: NEGATIVE
Leukocytes, UA: NEGATIVE
Nitrite, UA: NEGATIVE
POC,PROTEIN,UA: NEGATIVE
Spec Grav, UA: 1.01 (ref 1.010–1.025)
Urobilinogen, UA: 0.2 E.U./dL
pH, UA: 6.5 (ref 5.0–8.0)

## 2022-02-03 MED ORDER — BUDESONIDE-FORMOTEROL FUMARATE 80-4.5 MCG/ACT IN AERO: 2.0000 | INHALATION_SPRAY | Freq: Every day | RESPIRATORY_TRACT | 12 refills | Status: DC

## 2022-02-03 NOTE — Progress Notes (Signed)
ROB: She is doing well, no new concerns. Tdap/BTC done today.

## 2022-02-03 NOTE — Progress Notes (Signed)
ROB: Referred from midwives due to h/o of asthma. Prior to pregnancy, used albuterol inhaler intermittently. Now, is having to use her inhaler daily.  Denies recent asthma attacks requiring hospitalization. Will prescribe Symbicort for daily asthma maintenance and can now use albuterol inhaler for rescue dosing. May also need to consider changing BP meds if asthma still unable to be controlled as she is currently on a beta blocker (however is low dose). Patient also for 28 week labs today.   Plans to breastfeed, desires IUD Mirena for contraception. For Tdap today, signed blood consent.  Declined flu vaccine.  RTC in 2 weeks. For growth scan beginning at 32 weeks for BMI and NSTs. Needs Anesthesia consult between 30-34 weeks.

## 2022-02-04 ENCOUNTER — Encounter: Payer: Self-pay | Admitting: Certified Nurse Midwife

## 2022-02-04 ENCOUNTER — Encounter: Payer: Self-pay | Admitting: Obstetrics and Gynecology

## 2022-02-04 LAB — CBC
Hematocrit: 32.6 % — ABNORMAL LOW (ref 34.0–46.6)
Hemoglobin: 10.5 g/dL — ABNORMAL LOW (ref 11.1–15.9)
MCH: 26.4 pg — ABNORMAL LOW (ref 26.6–33.0)
MCHC: 32.2 g/dL (ref 31.5–35.7)
MCV: 82 fL (ref 79–97)
Platelets: 259 10*3/uL (ref 150–450)
RBC: 3.98 x10E6/uL (ref 3.77–5.28)
RDW: 12.5 % (ref 11.7–15.4)
WBC: 11.3 10*3/uL — ABNORMAL HIGH (ref 3.4–10.8)

## 2022-02-04 LAB — RPR: RPR Ser Ql: NONREACTIVE

## 2022-02-04 LAB — GLUCOSE, 1 HOUR GESTATIONAL: Gestational Diabetes Screen: 79 mg/dL (ref 70–139)

## 2022-02-09 ENCOUNTER — Observation Stay
Admission: EM | Admit: 2022-02-09 | Discharge: 2022-02-09 | Disposition: A | Discharge status: Home / Self Care | Payer: Medicaid Other | Attending: Obstetrics and Gynecology | Admitting: Obstetrics and Gynecology

## 2022-02-09 ENCOUNTER — Encounter: Payer: Self-pay | Admitting: Obstetrics and Gynecology

## 2022-02-09 ENCOUNTER — Other Ambulatory Visit: Payer: Self-pay

## 2022-02-09 ENCOUNTER — Encounter: Payer: Self-pay | Admitting: Certified Nurse Midwife

## 2022-02-09 DIAGNOSIS — O0992 Supervision of high risk pregnancy, unspecified, second trimester: Secondary | ICD-10-CM | POA: Insufficient documentation

## 2022-02-09 DIAGNOSIS — Z7951 Long term (current) use of inhaled steroids: Secondary | ICD-10-CM | POA: Insufficient documentation

## 2022-02-09 DIAGNOSIS — O10013 Pre-existing essential hypertension complicating pregnancy, third trimester: Secondary | ICD-10-CM | POA: Diagnosis not present

## 2022-02-09 DIAGNOSIS — R1030 Lower abdominal pain, unspecified: Secondary | ICD-10-CM | POA: Diagnosis not present

## 2022-02-09 DIAGNOSIS — O99513 Diseases of the respiratory system complicating pregnancy, third trimester: Secondary | ICD-10-CM | POA: Insufficient documentation

## 2022-02-09 DIAGNOSIS — R42 Dizziness and giddiness: Secondary | ICD-10-CM | POA: Diagnosis not present

## 2022-02-09 DIAGNOSIS — O26892 Other specified pregnancy related conditions, second trimester: Secondary | ICD-10-CM | POA: Diagnosis not present

## 2022-02-09 DIAGNOSIS — R531 Weakness: Secondary | ICD-10-CM | POA: Insufficient documentation

## 2022-02-09 DIAGNOSIS — Z3490 Encounter for supervision of normal pregnancy, unspecified, unspecified trimester: Secondary | ICD-10-CM | POA: Insufficient documentation

## 2022-02-09 DIAGNOSIS — Z79899 Other long term (current) drug therapy: Secondary | ICD-10-CM | POA: Insufficient documentation

## 2022-02-09 DIAGNOSIS — Z7982 Long term (current) use of aspirin: Secondary | ICD-10-CM | POA: Diagnosis not present

## 2022-02-09 DIAGNOSIS — Z3492 Encounter for supervision of normal pregnancy, unspecified, second trimester: Secondary | ICD-10-CM

## 2022-02-09 DIAGNOSIS — Z3A28 28 weeks gestation of pregnancy: Secondary | ICD-10-CM | POA: Diagnosis not present

## 2022-02-09 DIAGNOSIS — O26893 Other specified pregnancy related conditions, third trimester: Secondary | ICD-10-CM | POA: Insufficient documentation

## 2022-02-09 DIAGNOSIS — J45909 Unspecified asthma, uncomplicated: Secondary | ICD-10-CM | POA: Diagnosis not present

## 2022-02-09 DIAGNOSIS — O99891 Other specified diseases and conditions complicating pregnancy: Secondary | ICD-10-CM | POA: Diagnosis present

## 2022-02-09 DIAGNOSIS — O0993 Supervision of high risk pregnancy, unspecified, third trimester: Secondary | ICD-10-CM | POA: Insufficient documentation

## 2022-02-09 DIAGNOSIS — I10 Essential (primary) hypertension: Principal | ICD-10-CM

## 2022-02-09 NOTE — OB Triage Note (Signed)
Discharge instructions, labor precautions, and follow-up care reviewed with patient and significant other. All questions answered. Patient verbalized understanding. Discharged ambulatory off unit.   

## 2022-02-09 NOTE — ED Triage Notes (Signed)
Arrives c/o dizziness, weakness, and feeling heart flutter today while at work. Patient is [redacted] weeks pregnant, EDC:  04/29/2023.  G2 P0.  Patient also states that a friend tood her blood pressure while at work with her watch and BP was low, SBP in the 90's.  Patient is AAOx3.  Skin warm and dry. NAD

## 2022-02-09 NOTE — Discharge Summary (Signed)
Physician Final Progress Note  Patient ID: Joyce Smith MRN: [id number] DOB/AGE: [birth year] 20 y.o.  Admit date: 02/09/2022 Admitting provider: Hildred Laser, MD Discharge date: 02/09/2022   Admission Diagnoses:  1) intrauterine pregnancy at [redacted]w[redacted]d  2) dizzy/weak  Discharge Diagnoses:  Principal Problem:   Dizzy Active Problems:   [redacted] weeks gestation of pregnancy    History of Present Illness: The patient is a 21 y.o. female G2P0010 at [redacted]w[redacted]d who presents for feeling dizzy and weak while at work today. She also has occasional intermittent lower abdominal cramping. Her pregnancy is complicated by chronic hypertension. She takes labetalol BID.   She reports good fetal movement. She denies vaginal bleeding, leakage of fluid or uterine contractions. She admits normally adequate hydration. She was admitted for observation. Monitoring is reassuring. She is discharged to home with instructions and precautions.   Past Medical History:  Diagnosis Date   Asthma    Elevated blood pressure reading    Seasonal allergies     Past Surgical History:  Procedure Laterality Date   NONE      No current facility-administered medications on file prior to encounter.   Current Outpatient Medications on File Prior to Encounter  Medication Sig Dispense Refill   aspirin EC 81 MG tablet Take 2 tablets (162 mg total) by mouth daily. Swallow whole. 30 tablet 12   budesonide-formoterol (SYMBICORT) 80-4.5 MCG/ACT inhaler Inhale 2 puffs into the lungs daily. 1 each 12   labetalol (NORMODYNE) 100 MG tablet Take 1 tablet (100 mg total) by mouth 2 (two) times daily. 60 tablet 11   montelukast (SINGULAIR) 10 MG tablet TAKE 1 TABLET BY MOUTH EVERYDAY AT BEDTIME 30 tablet 11   Prenatal Vit-Fe Fumarate-FA (MULTIVITAMIN-PRENATAL) 27-0.8 MG TABS tablet Take 1 tablet by mouth daily at 12 noon.     Probiotic Product (PROBIOTIC PO) Take by mouth.     albuterol (VENTOLIN HFA) 108 (90 Base) MCG/ACT inhaler Inhale  2 puffs into the lungs every 6 (six) hours as needed for wheezing or shortness of breath. 8 g 0    No Known Allergies  Social History   Socioeconomic History   Marital status: Single    Spouse name: Not on file   Number of children: Not on file   Years of education: Not on file   Highest education level: Not on file  Occupational History   Not on file  Tobacco Use   Smoking status: Never   Smokeless tobacco: Never  Vaping Use   Vaping Use: Never used  Substance and Sexual Activity   Alcohol use: No   Drug use: Never   Sexual activity: Yes    Birth control/protection: None  Other Topics Concern   Not on file  Social History Narrative   Not on file   Social Determinants of Health   Financial Resource Strain: Not on file  Food Insecurity: Not on file  Transportation Needs: Not on file  Physical Activity: Not on file  Stress: Not on file  Social Connections: Not on file  Intimate Partner Violence: Not on file    Family History  Problem Relation Age of Onset   Asthma Mother    Hypertension Mother    Thyroid disease Mother    Stroke Maternal Grandmother    Cancer Paternal Grandmother        BREAST and Skin Cancer   Diabetes Paternal Grandmother    Hypertension Paternal Grandmother    Rashes / Skin problems Paternal Grandmother    Stroke  Paternal Grandmother      Review of Systems  Constitutional:  Negative for chills and fever.  HENT:  Negative for congestion, ear discharge, ear pain, hearing loss, sinus pain and sore throat.   Eyes:  Negative for blurred vision and double vision.  Respiratory:  Negative for cough, shortness of breath and wheezing.   Cardiovascular:  Negative for chest pain, palpitations and leg swelling.  Gastrointestinal:  Positive for abdominal pain. Negative for blood in stool, constipation, diarrhea, heartburn, melena, nausea and vomiting.  Genitourinary:  Negative for dysuria, flank pain, frequency, hematuria and urgency.   Musculoskeletal:  Negative for back pain, joint pain and myalgias.  Skin:  Negative for itching and rash.  Neurological:  Positive for dizziness and weakness. Negative for tingling, tremors, sensory change, speech change, focal weakness, seizures, loss of consciousness and headaches.  Endo/Heme/Allergies:  Negative for environmental allergies. Does not bruise/bleed easily.  Psychiatric/Behavioral:  Negative for depression, hallucinations, memory loss, substance abuse and suicidal ideas. The patient is not nervous/anxious and does not have insomnia.      Physical Exam: BP (!) 131/57   Pulse 82   Temp 98.8 F (37.1 C) (Oral)   Resp 18   Ht 5\' 4"  (1.626 m)   Wt 128.4 kg   LMP 07/22/2021 (Exact Date)   BMI 48.58 kg/m   Constitutional: Well nourished, well developed female in no acute distress.  HEENT: normal Skin: Warm and dry.  Cardiovascular: Regular rate and rhythm.   Respiratory: Clear to auscultation bilateral. Normal respiratory effort Abdomen: FHT present Psych: Alert and Oriented x3. No memory deficits. Normal mood and affect.   Toco: negative Fetal well being: 145 bpm, moderate variability, +accelerations, -decelerations  Consults: None  Significant Findings/ Diagnostic Studies: none  Procedures: NST  Hospital Course: The patient was admitted to Labor and Delivery Triage for observation.   Discharge Condition: good  Disposition: Discharge disposition: 01-Home or Self Care  Diet: Regular diet  Discharge Activity: Activity as tolerated   Allergies as of 02/09/2022   No Known Allergies      Medication List     TAKE these medications    albuterol 108 (90 Base) MCG/ACT inhaler Commonly known as: VENTOLIN HFA Inhale 2 puffs into the lungs every 6 (six) hours as needed for wheezing or shortness of breath.   aspirin EC 81 MG tablet Take 2 tablets (162 mg total) by mouth daily. Swallow whole.   budesonide-formoterol 80-4.5 MCG/ACT inhaler Commonly known  as: Symbicort Inhale 2 puffs into the lungs daily.   labetalol 100 MG tablet Commonly known as: NORMODYNE Take 1 tablet (100 mg total) by mouth 2 (two) times daily.   montelukast 10 MG tablet Commonly known as: SINGULAIR TAKE 1 TABLET BY MOUTH EVERYDAY AT BEDTIME   multivitamin-prenatal 27-0.8 MG Tabs tablet Take 1 tablet by mouth daily at 12 noon.   PROBIOTIC PO Take by mouth.         Total time spent taking care of this patient: 20 minutes  Signed: Rod Can, CNM  02/09/2022, 8:55 PM

## 2022-02-09 NOTE — OB Triage Note (Signed)
Patient is a G2P0010 at [redacted]w[redacted]d who presents to unit c/o dizziness, weakness while at work, and lower occasional lower abdominal cramping that comes and goes. Patient reports +FM, denies LOF and vaginal bleeding. External monitors applied and assessing. Initial FHT 145. Initial BP 136/54, cycling q54min.

## 2022-02-09 NOTE — ED Provider Notes (Signed)
   [facility] Provider Note    None    (approximate)   History   Dizziness   HPI  Joyce Smith is a 21 y.o. female with history of elevated blood pressure presents emergency department with dizziness and lower blood pressure at work.  And some abdominal pain.  Decreased movement      Physical Exam   Triage Vital Signs: ED Triage Vitals [02/09/22 1810]  Enc Vitals Group     BP      Pulse      Resp      Temp      Temp src      SpO2      Weight 283 lb (128.4 kg)     Height      Head Circumference      Peak Flow      Pain Score 5     Pain Loc      Pain Edu?      Excl. in [place]?     Most recent vital signs: There were no vitals filed for this visit.   General: Awake, no distress.   CV:  Good peripheral perfusion. regular rate and  rhythm Resp:  Normal effort. Lungs  Abd:  No distention.   Other:      ED Results / Procedures / Treatments   Labs (all labs ordered are listed, but only abnormal results are displayed) Labs Reviewed - No data to display   EKG     RADIOLOGY     PROCEDURES:   Procedures   MEDICATIONS ORDERED IN ED: Medications - No data to display   IMPRESSION / MDM / [place] / ED COURSE  I reviewed the triage vital signs and the nursing notes.                              Differential diagnosis includes, but is not limited to, preeclampsia, hypotension, hypertension, dizziness  Patient's presentation is most consistent with acute presentation with potential threat to life or bodily function.   Due to the chance of preeclampsia we will send patient to L&D.  Appears to be stable.  Blood pressure is elevated but not at stroke level.  Feel they can do all of the work-up upstairs.      FINAL CLINICAL IMPRESSION(S) / ED DIAGNOSES   Final diagnoses:  Hypertension, unspecified type     Rx / DC Orders   ED Discharge Orders     None        Note:  This document was prepared  using Dragon voice recognition software and may include unintentional dictation errors.    Versie Starks, PA-C 02/09/22 1816    Duffy Bruce, MD 02/09/22 2322

## 2022-02-10 NOTE — Telephone Encounter (Signed)
Mssg sent to pt.

## 2022-02-12 ENCOUNTER — Ambulatory Visit (INDEPENDENT_AMBULATORY_CARE_PROVIDER_SITE_OTHER): Payer: Medicaid Other

## 2022-02-12 DIAGNOSIS — Z0130 Encounter for examination of blood pressure without abnormal findings: Secondary | ICD-10-CM

## 2022-02-12 NOTE — Progress Notes (Signed)
Patient here for a blood pressure check.  Blood pressure was 126/81 Pulse was 82

## 2022-02-13 ENCOUNTER — Encounter: Payer: Self-pay | Admitting: Certified Nurse Midwife

## 2022-02-17 ENCOUNTER — Encounter: Payer: Self-pay | Admitting: Obstetrics and Gynecology

## 2022-02-17 ENCOUNTER — Ambulatory Visit (INDEPENDENT_AMBULATORY_CARE_PROVIDER_SITE_OTHER): Payer: Medicaid Other | Admitting: Obstetrics and Gynecology

## 2022-02-17 VITALS — BP 134/77 | HR 85 | Wt 291.1 lb

## 2022-02-17 DIAGNOSIS — Z3483 Encounter for supervision of other normal pregnancy, third trimester: Secondary | ICD-10-CM

## 2022-02-17 DIAGNOSIS — Z3A30 30 weeks gestation of pregnancy: Secondary | ICD-10-CM

## 2022-02-17 LAB — POCT URINALYSIS DIPSTICK OB
Bilirubin, UA: NEGATIVE
Blood, UA: NEGATIVE
Glucose, UA: NEGATIVE
Ketones, UA: NEGATIVE
Leukocytes, UA: NEGATIVE
Nitrite, UA: NEGATIVE
POC,PROTEIN,UA: NEGATIVE
Spec Grav, UA: 1.015 (ref 1.010–1.025)
Urobilinogen, UA: 0.2 E.U./dL
pH, UA: 6.5 (ref 5.0–8.0)

## 2022-02-17 NOTE — Progress Notes (Signed)
ROB. Patient states daily fetal movement with increased back pain. Discussed belly bands and the benefits. She states since using Symbicort inhaler her asthma is much better along with dizziness. She states no questions or concerns at this time.

## 2022-02-17 NOTE — Progress Notes (Signed)
ROB: Some pelvic pressure-bilirubin discussed.  Reports daily fetal movement.  Taking aspirin as directed.  Discussed remainder of pregnancy including ultrasound at 36 weeks, weekly NSTs at 36 weeks, repeat anesthesia consult 36 weeks, induction for elevated BMI at 39 weeks.  Methods of induction discussed, all questions answered.

## 2022-02-21 ENCOUNTER — Encounter: Payer: Self-pay | Admitting: Certified Nurse Midwife

## 2022-02-24 ENCOUNTER — Encounter: Payer: Self-pay | Admitting: Certified Nurse Midwife

## 2022-03-03 ENCOUNTER — Ambulatory Visit (INDEPENDENT_AMBULATORY_CARE_PROVIDER_SITE_OTHER): Payer: Medicaid Other | Admitting: Certified Nurse Midwife

## 2022-03-03 VITALS — BP 137/79 | HR 88 | Wt 290.7 lb

## 2022-03-03 DIAGNOSIS — Z3A32 32 weeks gestation of pregnancy: Secondary | ICD-10-CM

## 2022-03-03 DIAGNOSIS — O10913 Unspecified pre-existing hypertension complicating pregnancy, third trimester: Secondary | ICD-10-CM

## 2022-03-03 DIAGNOSIS — O10919 Unspecified pre-existing hypertension complicating pregnancy, unspecified trimester: Secondary | ICD-10-CM

## 2022-03-03 DIAGNOSIS — O99213 Obesity complicating pregnancy, third trimester: Secondary | ICD-10-CM

## 2022-03-03 DIAGNOSIS — Z23 Encounter for immunization: Secondary | ICD-10-CM | POA: Diagnosis not present

## 2022-03-03 DIAGNOSIS — E669 Obesity, unspecified: Secondary | ICD-10-CM

## 2022-03-03 NOTE — Progress Notes (Signed)
ROB doing well, feeling movement, states it has changed. Discussed growing fetus and how movments changed. Fetal movement audible with FHT.  Discussed NST next visit and MFM u/s for growth. She verbalizes and agrees. Follow up 1 wk for NST.   Philip Aspen, CNM

## 2022-03-03 NOTE — Progress Notes (Signed)
Body mass index is 49.9 kg/m.

## 2022-03-03 NOTE — Patient Instructions (Addendum)
Nonstress Test A nonstress test is a procedure that is done during pregnancy in order to check the baby's heartbeat. The procedure can help to show if the baby (fetus) is healthy. It is commonly done when: The baby is past his or her due date. The pregnancy is high risk. The baby is moving less than normal. The mother has lost a pregnancy in the past. The health care provider suspects a problem with the baby's growth. There is too much or too little amniotic fluid. The procedure is often done in the third trimester of pregnancy to find out if an early delivery is needed and whether such a delivery is safe. During a nonstress test, the baby's heartbeat is monitored when the baby is resting and when the baby is moving. If the baby is healthy, the heart rate will increase when he or she moves or kicks and will return to normal when he or she rests. Tell a health care provider about: Any allergies you have. Any medical conditions you have. All medicines you are taking, including vitamins, herbs, eye drops, creams, and over-the-counter medicines. Any surgeries you have had. Any past pregnancies you have had. What are the risks? There are no risks to you or your baby from a nonstress test. This procedure should not be painful or uncomfortable. What happens before the procedure? Eat a meal right before the test or as directed by your health care provider. Food may help encourage the baby to move. Use the restroom right before the test. What happens during the procedure?  Two monitors will be placed on your abdomen. One will record the baby's heart rate and the other will record the contractions of your uterus. You may be asked to lie down on your side or to sit upright. You may be given a button to press when you feel your baby move. Your health care provider will listen to your baby's heartbeat and record it. He or she may also watch your baby's heartbeat on a screen. If the baby seems to be  sleeping, you may be asked to drink some juice or soda, eat a snack, or change positions. The procedure may vary among health care providers and hospitals. What can I expect after procedure? Your health care provider will discuss the test results with you and make recommendations for the future. Depending on the results, your health care provider may order additional tests or another course of action. If your health care provider gave you any diet or activity instructions, make sure to follow them. Keep all follow-up visits. This is important. Summary A nonstress test is a procedure that is done during pregnancy in order to check the baby's heartbeat. The procedure can help show if the baby is healthy. The procedure is often done in the third trimester of pregnancy to find out if an early delivery is needed and whether such a delivery is safe. During a nonstress test, the baby's heartbeat is monitored when the baby is resting and when the baby is moving. If the baby is healthy, the heart rate will increase when he or she moves or kicks and will return to normal when he or she rests. Your health care provider will discuss the test results with you and make recommendations for the future. This information is not intended to replace advice given to you by your health care provider. Make sure you discuss any questions you have with your health care provider. Document Revised: 01/08/2021 Document Reviewed: 02/05/2020 Elsevier Patient   Education  [place].    Influenza (Flu) Vaccine (Inactivated or Recombinant): What You Need to Know 1. Why get vaccinated? Influenza vaccine can prevent influenza (flu). Flu is a contagious disease that spreads around the [place] every year, usually between October and May. Anyone can get the flu, but it is more dangerous for some people. Infants and young children, people 21 years and older, pregnant people, and people with certain health conditions or a  weakened immune system are at greatest risk of flu complications. Pneumonia, bronchitis, sinus infections, and ear infections are examples of flu-related complications. If you have a medical condition, such as heart disease, cancer, or diabetes, flu can make it worse. Flu can cause fever and chills, sore throat, muscle aches, fatigue, cough, headache, and runny or stuffy nose. Some people may have vomiting and diarrhea, though this is more common in children than adults. In an average year, thousands of people in the [place] die from flu, and many more are hospitalized. Flu vaccine prevents millions of illnesses and flu-related visits to the doctor each year. 2. Influenza vaccines CDC recommends everyone 6 months and older get vaccinated every flu season. Children 6 months through 17 years of age may need 2 doses during a single flu season. Everyone else needs only 1 dose each flu season. It takes about 2 weeks for protection to develop after vaccination. There are many flu viruses, and they are always changing. Each year a new flu vaccine is made to protect against the influenza viruses believed to be likely to cause disease in the upcoming flu season. Even when the vaccine doesn't exactly match these viruses, it may still provide some protection. Influenza vaccine does not cause flu. Influenza vaccine may be given at the same time as other vaccines. 3. Talk with your health care provider Tell your vaccination provider if the person getting the vaccine: Has had an allergic reaction after a previous dose of influenza vaccine, or has any severe, life-threatening allergies Has ever had Guillain-Barr Syndrome (also called "GBS") In some cases, your health care provider may decide to postpone influenza vaccination until a future visit. Influenza vaccine can be administered at any time during pregnancy. People who are or will be pregnant during influenza season should receive inactivated influenza  vaccine. People with minor illnesses, such as a cold, may be vaccinated. People who are moderately or severely ill should usually wait until they recover before getting influenza vaccine. Your health care provider can give you more information. 4. Risks of a vaccine reaction Soreness, redness, and swelling where the shot is given, fever, muscle aches, and headache can happen after influenza vaccination. There may be a very small increased risk of Guillain-Barr Syndrome (GBS) after inactivated influenza vaccine (the flu shot). Young children who get the flu shot along with pneumococcal vaccine (PCV13) and/or DTaP vaccine at the same time might be slightly more likely to have a seizure caused by fever. Tell your health care provider if a child who is getting flu vaccine has ever had a seizure. People sometimes faint after medical procedures, including vaccination. Tell your provider if you feel dizzy or have vision changes or ringing in the ears. As with any medicine, there is a very remote chance of a vaccine causing a severe allergic reaction, other serious injury, or death. 5. What if there is a serious problem? An allergic reaction could occur after the vaccinated person leaves the clinic. If you see signs of a severe allergic reaction (  hives, swelling of the face and throat, difficulty breathing, a fast heartbeat, dizziness, or weakness), call 9-1-1 and get the person to the nearest hospital. For other signs that concern you, call your health care provider. Adverse reactions should be reported to the Vaccine Adverse Event Reporting System (VAERS). Your health care provider will usually file this report, or you can do it yourself. Visit the VAERS website at www.vaers.LAgents.no or call 726-194-8635. VAERS is only for reporting reactions, and VAERS staff members do not give medical advice. 6. The National Vaccine Injury Compensation Program The [employer] Vaccine Injury Compensation Program (VICP) is a  federal program that was created to compensate people who may have been injured by certain vaccines. Claims regarding alleged injury or death due to vaccination have a time limit for filing, which may be as short as two years. Visit the VICP website at SpiritualWord.at or call 765-701-2446 to learn about the program and about filing a claim. 7. How can I learn more? Ask your health care provider. Call your local or state health department. Visit the website of the Food and Drug Administration (FDA) for vaccine package inserts and additional information at FinderList.no. Contact the Centers for Disease Control and Prevention (CDC): Call (919) 884-9682 (1-800-CDC-INFO) or Visit CDC's website at BiotechRoom.com.cy. Source: CDC Vaccine Information Statement Inactivated Influenza Vaccine (12/15/2019) This same material is available at FootballExhibition.com.br for no charge. This information is not intended to replace advice given to you by your health care provider. Make sure you discuss any questions you have with your health care provider. Document Revised: 03/26/2021 Document Reviewed: 01/16/2021 Elsevier Patient Education  2023 [employer].

## 2022-03-04 ENCOUNTER — Telehealth: Payer: Self-pay

## 2022-03-04 NOTE — Telephone Encounter (Signed)
Left voicemail to return call at her earliest convenience regarding u/s appointment. [facility] does not have an opening until 11/30. [facility] has an opening tomorrow at 1:30 due to cancellation. Advised I will send a my chart message as well.

## 2022-03-05 ENCOUNTER — Ambulatory Visit: Payer: Medicaid Other | Admitting: *Deleted

## 2022-03-05 ENCOUNTER — Ambulatory Visit: Payer: Medicaid Other | Attending: Certified Nurse Midwife

## 2022-03-05 ENCOUNTER — Other Ambulatory Visit: Payer: Self-pay | Admitting: *Deleted

## 2022-03-05 VITALS — BP 127/67 | HR 103

## 2022-03-05 DIAGNOSIS — R42 Dizziness and giddiness: Secondary | ICD-10-CM | POA: Insufficient documentation

## 2022-03-05 DIAGNOSIS — O10013 Pre-existing essential hypertension complicating pregnancy, third trimester: Secondary | ICD-10-CM | POA: Diagnosis not present

## 2022-03-05 DIAGNOSIS — Z363 Encounter for antenatal screening for malformations: Secondary | ICD-10-CM | POA: Insufficient documentation

## 2022-03-05 DIAGNOSIS — O10913 Unspecified pre-existing hypertension complicating pregnancy, third trimester: Secondary | ICD-10-CM | POA: Insufficient documentation

## 2022-03-05 DIAGNOSIS — Z3492 Encounter for supervision of normal pregnancy, unspecified, second trimester: Secondary | ICD-10-CM

## 2022-03-05 DIAGNOSIS — O10919 Unspecified pre-existing hypertension complicating pregnancy, unspecified trimester: Secondary | ICD-10-CM

## 2022-03-05 DIAGNOSIS — Z3A32 32 weeks gestation of pregnancy: Secondary | ICD-10-CM | POA: Insufficient documentation

## 2022-03-05 DIAGNOSIS — E669 Obesity, unspecified: Secondary | ICD-10-CM

## 2022-03-05 DIAGNOSIS — Z3A28 28 weeks gestation of pregnancy: Secondary | ICD-10-CM | POA: Insufficient documentation

## 2022-03-05 DIAGNOSIS — O99213 Obesity complicating pregnancy, third trimester: Secondary | ICD-10-CM | POA: Insufficient documentation

## 2022-03-10 ENCOUNTER — Ambulatory Visit: Payer: Medicaid Other

## 2022-03-10 ENCOUNTER — Ambulatory Visit (INDEPENDENT_AMBULATORY_CARE_PROVIDER_SITE_OTHER): Payer: Medicaid Other | Admitting: Obstetrics

## 2022-03-10 VITALS — BP 137/84 | HR 87 | Wt 293.0 lb

## 2022-03-10 DIAGNOSIS — O99213 Obesity complicating pregnancy, third trimester: Secondary | ICD-10-CM

## 2022-03-10 DIAGNOSIS — E669 Obesity, unspecified: Secondary | ICD-10-CM

## 2022-03-10 DIAGNOSIS — O10913 Unspecified pre-existing hypertension complicating pregnancy, third trimester: Secondary | ICD-10-CM | POA: Diagnosis not present

## 2022-03-10 DIAGNOSIS — Z3A33 33 weeks gestation of pregnancy: Secondary | ICD-10-CM

## 2022-03-10 DIAGNOSIS — Z3483 Encounter for supervision of other normal pregnancy, third trimester: Secondary | ICD-10-CM

## 2022-03-10 DIAGNOSIS — O10919 Unspecified pre-existing hypertension complicating pregnancy, unspecified trimester: Secondary | ICD-10-CM

## 2022-03-10 LAB — POCT URINALYSIS DIPSTICK OB
Appearance: NORMAL
Bilirubin, UA: NEGATIVE
Blood, UA: NEGATIVE
Glucose, UA: NEGATIVE
Ketones, UA: NEGATIVE
Leukocytes, UA: NEGATIVE
Nitrite, UA: NEGATIVE
Odor: NORMAL
Spec Grav, UA: 1.01 (ref 1.010–1.025)
Urobilinogen, UA: 0.2 E.U./dL
pH, UA: 6 (ref 5.0–8.0)

## 2022-03-10 NOTE — Progress Notes (Signed)
Doing well, some left sided abdominal pain yesterday. Mentioned she may have strained herself at work. No complaints.

## 2022-03-10 NOTE — Progress Notes (Signed)
Subjective:    Joyce Smith is a 21 y.o. female who presents for fetal monitoring per order from Philip Aspen, [place].    Results reviewed and discussed with patient by Lloyd Huger, CNM.

## 2022-03-10 NOTE — Progress Notes (Signed)
ROB at [redacted]w[redacted]d. Active baby. Denies ctx, LOF, and vaginal bleeding. Janace Hoard had an episode of cramping that resolved spontaneously. Reviewed BH ctx and s/s of  PTL. Growth scan 03/05/22 was WNL. S>D today. Has future growth [place] scheduled through MFM. Reactive NST today (see note). Questions about labor, induction, and cervical ripening answered. RTC in 2 weeks.  Lloyd Huger, CNM

## 2022-03-16 ENCOUNTER — Encounter: Payer: Self-pay | Admitting: Obstetrics

## 2022-03-16 ENCOUNTER — Encounter: Payer: Self-pay | Admitting: Certified Nurse Midwife

## 2022-03-23 NOTE — Progress Notes (Unsigned)
    NURSE VISIT NST NOTE  Subjective:    Patient ID: RESHANDA LEWEY, female    DOB: 07/06/2000, 21 y.o.   MRN: [id number]  HPI  Patient is a 21 y.o. G39P0010 female who presents for fetal monitoring per order from Guadlupe Spanish, CNM.   The following portions of the patient's history were reviewed and updated as appropriate: allergies, current medications, past family history, past medical history, past social history, past surgical history, and problem list.  Review of Systems Pertinent items are noted in HPI.   Objective:   Last menstrual period 07/22/2021. There is no height or weight on file to calculate BMI. Estimated Date of Delivery: 04/28/22 General appearance: alert, cooperative, and no distress    Assessment:   1. Encounter for supervision of other normal pregnancy, third trimester   2. Obesity affecting pregnancy in third trimester, unspecified obesity type   3. Chronic hypertension affecting pregnancy   4. [redacted] weeks gestation of pregnancy      Plan:   There are no diagnoses linked to this encounter.    Results reviewed and discussed with patient by  Brennan Bailey, MD.    Rocco Serene, LPN

## 2022-03-24 ENCOUNTER — Ambulatory Visit (INDEPENDENT_AMBULATORY_CARE_PROVIDER_SITE_OTHER): Payer: Medicaid Other | Admitting: Obstetrics and Gynecology

## 2022-03-24 ENCOUNTER — Encounter: Payer: Self-pay | Admitting: Obstetrics and Gynecology

## 2022-03-24 ENCOUNTER — Ambulatory Visit (INDEPENDENT_AMBULATORY_CARE_PROVIDER_SITE_OTHER): Payer: Medicaid Other

## 2022-03-24 VITALS — BP 125/80 | HR 97 | Wt 297.9 lb

## 2022-03-24 VITALS — BP 125/80 | HR 97 | Wt 297.0 lb

## 2022-03-24 DIAGNOSIS — O99213 Obesity complicating pregnancy, third trimester: Secondary | ICD-10-CM | POA: Diagnosis not present

## 2022-03-24 DIAGNOSIS — O10919 Unspecified pre-existing hypertension complicating pregnancy, unspecified trimester: Secondary | ICD-10-CM

## 2022-03-24 DIAGNOSIS — E669 Obesity, unspecified: Secondary | ICD-10-CM | POA: Diagnosis not present

## 2022-03-24 DIAGNOSIS — Z3A35 35 weeks gestation of pregnancy: Secondary | ICD-10-CM | POA: Diagnosis not present

## 2022-03-24 DIAGNOSIS — Z3A34 34 weeks gestation of pregnancy: Secondary | ICD-10-CM

## 2022-03-24 DIAGNOSIS — Z3483 Encounter for supervision of other normal pregnancy, third trimester: Secondary | ICD-10-CM

## 2022-03-24 LAB — POCT URINALYSIS DIPSTICK OB
Glucose, UA: NEGATIVE
Leukocytes, UA: NEGATIVE
POC,PROTEIN,UA: NEGATIVE
Spec Grav, UA: 1.015 (ref 1.010–1.025)

## 2022-03-24 NOTE — Progress Notes (Signed)
ROB: She has no complaints.  Feels daily fetal movement.  NST reactive today.  She will get weekly NSTs going forward.  Another anesthesia consult was ordered because her BMI is now over 50.  Ultrasound for fetal growth has been scheduled.  Cultures next visit.

## 2022-03-24 NOTE — Progress Notes (Signed)
ROB. Patient states fetal movement with occasional pain. NST performed today. Patient states no questions or concerns at this time.

## 2022-03-25 ENCOUNTER — Encounter: Payer: Self-pay | Admitting: Obstetrics and Gynecology

## 2022-03-31 ENCOUNTER — Ambulatory Visit (INDEPENDENT_AMBULATORY_CARE_PROVIDER_SITE_OTHER): Payer: Medicaid Other | Admitting: Obstetrics

## 2022-03-31 ENCOUNTER — Other Ambulatory Visit (HOSPITAL_COMMUNITY)
Admission: RE | Admit: 2022-03-31 | Discharge: 2022-03-31 | Disposition: A | Discharge status: Home / Self Care | Payer: Medicaid Other | Source: Ambulatory Visit | Attending: Obstetrics | Admitting: Obstetrics

## 2022-03-31 ENCOUNTER — Encounter
Admission: RE | Admit: 2022-03-31 | Discharge: 2022-03-31 | Disposition: A | Discharge status: Home / Self Care | Payer: Medicaid Other | Source: Ambulatory Visit | Attending: Anesthesiology | Admitting: Anesthesiology

## 2022-03-31 VITALS — BP 137/86 | HR 99 | Wt 296.8 lb

## 2022-03-31 DIAGNOSIS — Z3403 Encounter for supervision of normal first pregnancy, third trimester: Secondary | ICD-10-CM

## 2022-03-31 DIAGNOSIS — Z3A36 36 weeks gestation of pregnancy: Secondary | ICD-10-CM

## 2022-03-31 DIAGNOSIS — Z113 Encounter for screening for infections with a predominantly sexual mode of transmission: Secondary | ICD-10-CM | POA: Diagnosis present

## 2022-03-31 NOTE — Progress Notes (Signed)
ROB at [redacted]w[redacted]d. Active baby. Denies ctx, LOF, and vaginal bleeding. [name] reports that she has been sleeping poorly. Discussed sleep hygiene, Unisom. She reports that her asthma has been "okay," and she has not had to use her inhaler daily. BPP scheduled for tomorrow. Herbal labor prep handout given. Discussed ways to encourage spontaneous labor at term. GBS and GC/chlamydia swabs self-collected. Reviewed s/s of labor and preeclampsia danger signs. ROB and NST in one week.  Guadlupe Spanish, CNM

## 2022-03-31 NOTE — Consult Note (Signed)
[facility] Anesthesia Consultation  RAYHANA SLIDER EPP:[id number] DOB: 02-07-2001 DOA: 03/31/2022 PCP: Patient, No Pcp Per   Requesting physician: Dr. Logan Bores Date of consultation: 03/31/2022 Reason for consultation: Obesity during pregnancy  CHIEF COMPLAINT:  Obesity during pregnancy  HISTORY OF PRESENT ILLNESS: Joyce Smith  is a 21 y.o. female with a BMI >50.   PAST MEDICAL HISTORY:   Past Medical History:  Diagnosis Date   Anxiety    Asthma    Depression    Elevated blood pressure reading    Heart murmur    Hypertension    Seasonal allergies     PAST SURGICAL HISTORY:  Past Surgical History:  Procedure Laterality Date   NONE      SOCIAL HISTORY:  Social History   Tobacco Use   Smoking status: Never   Smokeless tobacco: Never  Substance Use Topics   Alcohol use: No    FAMILY HISTORY:  Family History  Problem Relation Age of Onset   Asthma Mother    Hypertension Mother    Thyroid disease Mother    Stroke Maternal Grandmother    Cancer Paternal Grandmother        BREAST and Skin Cancer   Diabetes Paternal Grandmother    Hypertension Paternal Grandmother    Rashes / Skin problems Paternal Grandmother    Stroke Paternal Grandmother    Heart disease Neg Hx     DRUG ALLERGIES: No Known Allergies  REVIEW OF SYSTEMS:   RESPIRATORY: No cough, shortness of breath, wheezing.  CARDIOVASCULAR: No chest pain, orthopnea, edema.  HEMATOLOGY: No anemia, easy bruising or bleeding SKIN: No rash or lesion. NEUROLOGIC: No tingling, numbness, weakness.  PSYCHIATRY: No anxiety or depression.   MEDICATIONS AT HOME:  Prior to Admission medications   Medication Sig Start Date End Date Taking? Authorizing Provider  albuterol (VENTOLIN HFA) 108 (90 Base) MCG/ACT inhaler Inhale 2 puffs into the lungs every 6 (six) hours as needed for wheezing or shortness of breath. 07/24/20   Doreene Burke, CNM  aspirin EC 81 MG tablet  Take 2 tablets (162 mg total) by mouth daily. Swallow whole. 11/03/21   Glenetta Borg, CNM  budesonide-formoterol (SYMBICORT) 80-4.5 MCG/ACT inhaler Inhale 2 puffs into the lungs daily. 02/03/22   Hildred Laser, MD  ferrous sulfate 324 (65 Fe) MG TBEC Take by mouth.    [provider]  fexofenadine (ALLEGRA) 180 MG tablet Take 180 mg by mouth daily.    [provider]  labetalol (NORMODYNE) 100 MG tablet Take 1 tablet (100 mg total) by mouth 2 (two) times daily. 05/26/21   Doreene Burke, CNM  montelukast (SINGULAIR) 10 MG tablet TAKE 1 TABLET BY MOUTH EVERYDAY AT BEDTIME 10/14/20   Doreene Burke, CNM  Prenatal Vit-Fe Fumarate-FA (MULTIVITAMIN-PRENATAL) 27-0.8 MG TABS tablet Take 1 tablet by mouth daily at 12 noon.    [provider]  Probiotic Product (PROBIOTIC PO) Take by mouth.    [provider]      PHYSICAL EXAMINATION:   VITAL SIGNS: Last menstrual period 07/22/2021.  GENERAL:  21 y.o.-year-old patient no acute distress.  HEENT: Head atraumatic, normocephalic. Oropharynx and nasopharynx clear. MP 2, TM distance >3 cm, normal mouth opening. LUNGS: No use of accessory muscles of respiration.   EXTREMITIES: No pedal edema, cyanosis, or clubbing.  NEUROLOGIC: normal gait PSYCHIATRIC: The patient is alert and oriented x 3.  SKIN: No obvious rash, lesion, or ulcer.    IMPRESSION AND PLAN:   Joyce Smith  is a 21 y.o. female presenting with obesity during pregnancy. BMI is currently >50 at [redacted] weeks gestation.   We discussed analgesic options during labor including epidural analgesia. Discussed that in obesity there can be increased difficulty with epidural placement or even failure of successful epidural. We also discussed that even after successful epidural placement there is increased risk of catheter migration out of the epidural space that would require catheter replacement. Discussed use of epidural vs spinal vs GA if cesarean delivery  is required. Discussed increased risk of difficult intubation during pregnancy should an emergency cesarean delivery be required.   This patient is appropriate for anesthetic care at [facility] in [place].  We discussed the limitations of a community hospital including but not limited to staffing, imaging, medications, and blood products.  The patient is aware of these limitations.  All questions answered and concerns addressed.

## 2022-04-01 ENCOUNTER — Other Ambulatory Visit: Payer: Self-pay | Admitting: Obstetrics

## 2022-04-01 ENCOUNTER — Ambulatory Visit: Payer: Medicaid Other | Admitting: *Deleted

## 2022-04-01 ENCOUNTER — Ambulatory Visit: Payer: Medicaid Other | Attending: Obstetrics

## 2022-04-01 VITALS — BP 146/56 | HR 78

## 2022-04-01 DIAGNOSIS — E669 Obesity, unspecified: Secondary | ICD-10-CM

## 2022-04-01 DIAGNOSIS — O10913 Unspecified pre-existing hypertension complicating pregnancy, third trimester: Secondary | ICD-10-CM | POA: Diagnosis not present

## 2022-04-01 DIAGNOSIS — Z3A36 36 weeks gestation of pregnancy: Secondary | ICD-10-CM | POA: Insufficient documentation

## 2022-04-01 DIAGNOSIS — O283 Abnormal ultrasonic finding on antenatal screening of mother: Secondary | ICD-10-CM

## 2022-04-01 DIAGNOSIS — O99213 Obesity complicating pregnancy, third trimester: Secondary | ICD-10-CM | POA: Diagnosis not present

## 2022-04-01 DIAGNOSIS — Z3492 Encounter for supervision of normal pregnancy, unspecified, second trimester: Secondary | ICD-10-CM

## 2022-04-01 DIAGNOSIS — O10013 Pre-existing essential hypertension complicating pregnancy, third trimester: Secondary | ICD-10-CM | POA: Diagnosis not present

## 2022-04-01 DIAGNOSIS — R42 Dizziness and giddiness: Secondary | ICD-10-CM

## 2022-04-01 DIAGNOSIS — Z3A28 28 weeks gestation of pregnancy: Secondary | ICD-10-CM | POA: Insufficient documentation

## 2022-04-01 DIAGNOSIS — Z363 Encounter for antenatal screening for malformations: Secondary | ICD-10-CM | POA: Diagnosis not present

## 2022-04-01 NOTE — Procedures (Signed)
Joyce Smith 03-Apr-2001 [redacted]w[redacted]d  Fetus A Non-Stress Test Interpretation for 04/01/22  Indication: Unsatisfactory BPP  Fetal Heart Rate A Mode: External Baseline Rate (A): 135 bpm Variability: Moderate Accelerations: 15 x 15 Decelerations: None Multiple birth?: No  Uterine Activity Mode: Palpation, Toco Contraction Frequency (min): none Resting Tone Palpated: Relaxed  Interpretation (Fetal Testing) Nonstress Test Interpretation: Reactive Overall Impression: Reassuring for gestational age Comments: Dr. Judeth Cornfield reviewed tracing

## 2022-04-02 LAB — CERVICOVAGINAL ANCILLARY ONLY
Chlamydia: NEGATIVE
Comment: NEGATIVE
Comment: NEGATIVE
Comment: NORMAL
Neisseria Gonorrhea: NEGATIVE
Trichomonas: NEGATIVE

## 2022-04-04 LAB — CULTURE, BETA STREP (GROUP B ONLY): Strep Gp B Culture: NEGATIVE

## 2022-04-09 DIAGNOSIS — Z3483 Encounter for supervision of other normal pregnancy, third trimester: Secondary | ICD-10-CM | POA: Insufficient documentation

## 2022-04-09 DIAGNOSIS — O99213 Obesity complicating pregnancy, third trimester: Secondary | ICD-10-CM | POA: Insufficient documentation

## 2022-04-09 DIAGNOSIS — O10919 Unspecified pre-existing hypertension complicating pregnancy, unspecified trimester: Secondary | ICD-10-CM | POA: Insufficient documentation

## 2022-04-09 NOTE — Progress Notes (Signed)
    NURSE VISIT NOTE  Subjective:    Patient ID: Joyce Smith, female    DOB: 07-29-2000, 21 y.o.   MRN: [id number]  HPI  Patient is a 21 y.o. G84P0010 female who presents for fetal monitoring per order from Guadlupe Spanish, CNM.   Objective:    BP 135/84   Pulse 80   Ht 5\' 4"  (1.626 m)   Wt 298 lb 11.2 oz (135.5 kg)   LMP 07/22/2021 (Exact Date)   BMI 51.27 kg/m   Estimated Date of Delivery: 04/28/22  Assessment:   1. Encounter for supervision of other normal pregnancy, third trimester   2. Obesity affecting pregnancy in third trimester, unspecified obesity type   3. Chronic hypertension affecting pregnancy   4. [redacted] weeks gestation of pregnancy      Plan:   Results reviewed and discussed with patient by  04/30/22, MD.     Brennan Bailey, LPN

## 2022-04-10 ENCOUNTER — Encounter: Payer: Self-pay | Admitting: Obstetrics and Gynecology

## 2022-04-10 ENCOUNTER — Ambulatory Visit (INDEPENDENT_AMBULATORY_CARE_PROVIDER_SITE_OTHER): Payer: Medicaid Other | Admitting: Obstetrics and Gynecology

## 2022-04-10 ENCOUNTER — Ambulatory Visit (INDEPENDENT_AMBULATORY_CARE_PROVIDER_SITE_OTHER): Payer: Medicaid Other

## 2022-04-10 VITALS — BP 135/84 | HR 80 | Ht 64.0 in | Wt 298.7 lb

## 2022-04-10 VITALS — BP 135/84 | HR 80 | Wt 298.7 lb

## 2022-04-10 DIAGNOSIS — Z3483 Encounter for supervision of other normal pregnancy, third trimester: Secondary | ICD-10-CM

## 2022-04-10 DIAGNOSIS — O99213 Obesity complicating pregnancy, third trimester: Secondary | ICD-10-CM

## 2022-04-10 DIAGNOSIS — O2693 Pregnancy related conditions, unspecified, third trimester: Secondary | ICD-10-CM

## 2022-04-10 DIAGNOSIS — O10919 Unspecified pre-existing hypertension complicating pregnancy, unspecified trimester: Secondary | ICD-10-CM

## 2022-04-10 DIAGNOSIS — Z3A37 37 weeks gestation of pregnancy: Secondary | ICD-10-CM

## 2022-04-10 DIAGNOSIS — Z3403 Encounter for supervision of normal first pregnancy, third trimester: Secondary | ICD-10-CM

## 2022-04-10 DIAGNOSIS — O10013 Pre-existing essential hypertension complicating pregnancy, third trimester: Secondary | ICD-10-CM

## 2022-04-10 DIAGNOSIS — E669 Obesity, unspecified: Secondary | ICD-10-CM

## 2022-04-10 LAB — POCT URINALYSIS DIPSTICK
Appearance: NORMAL
Bilirubin, UA: NEGATIVE
Blood, UA: NEGATIVE
Glucose, UA: NEGATIVE
Ketones, UA: NEGATIVE
Leukocytes, UA: NEGATIVE
Nitrite, UA: NEGATIVE
Odor: NORMAL
Protein, UA: NEGATIVE
Spec Grav, UA: 1.02 (ref 1.010–1.025)
Urobilinogen, UA: 0.2 E.U./dL
pH, UA: 6 (ref 5.0–8.0)

## 2022-04-10 NOTE — Progress Notes (Signed)
ROB:  Pt doing well.  Denies contractions.  NST reactive.  Induction scheduled. Check cervix next week and base induction method on cervix. Induction on Dec 13th at [facility] - please inform pt.

## 2022-04-10 NOTE — Progress Notes (Signed)
ROB [redacted]w[redacted]d: Doing well. Patient states she is uncertain if she is leaking fluid, has had wet underwear for past two days.

## 2022-04-13 ENCOUNTER — Encounter: Payer: Self-pay | Admitting: Obstetrics and Gynecology

## 2022-04-15 DIAGNOSIS — Z3403 Encounter for supervision of normal first pregnancy, third trimester: Secondary | ICD-10-CM | POA: Insufficient documentation

## 2022-04-15 NOTE — Progress Notes (Signed)
    NURSE VISIT NOTE  Subjective:    Patient ID: Joyce Smith, female    DOB: [birth year], 21 y.o.   MRN: [id number]  HPI  Patient is a 21 y.o. G40P0010 female who presents for fetal monitoring per order from Brennan Bailey, MD.   Objective:    BP (!) 134/58   Pulse 81   Ht 5\' 4"  (1.626 m)   Wt (!) 301 lb 14.4 oz (136.9 kg)   LMP 07/22/2021 (Exact Date)   BMI 51.82 kg/m  Estimated Date of Delivery: 04/28/22  Assessment:   1. Supervision of normal first pregnancy in third trimester   2. Obesity affecting pregnancy in third trimester, unspecified obesity type   3. Chronic hypertension affecting pregnancy   4. [redacted] weeks gestation of pregnancy      Plan:   Results reviewed and discussed with patient by  04/30/22, MD.     Brennan Bailey, LPN

## 2022-04-16 ENCOUNTER — Ambulatory Visit (INDEPENDENT_AMBULATORY_CARE_PROVIDER_SITE_OTHER): Payer: Medicaid Other | Admitting: Obstetrics and Gynecology

## 2022-04-16 ENCOUNTER — Ambulatory Visit: Payer: Medicaid Other

## 2022-04-16 VITALS — BP 134/58 | HR 81 | Ht 64.0 in | Wt 301.9 lb

## 2022-04-16 VITALS — BP 134/58 | HR 81 | Wt 301.9 lb

## 2022-04-16 DIAGNOSIS — Z3A38 38 weeks gestation of pregnancy: Secondary | ICD-10-CM

## 2022-04-16 DIAGNOSIS — O10919 Unspecified pre-existing hypertension complicating pregnancy, unspecified trimester: Secondary | ICD-10-CM

## 2022-04-16 DIAGNOSIS — O99213 Obesity complicating pregnancy, third trimester: Secondary | ICD-10-CM

## 2022-04-16 DIAGNOSIS — Z3403 Encounter for supervision of normal first pregnancy, third trimester: Secondary | ICD-10-CM

## 2022-04-16 DIAGNOSIS — Z3483 Encounter for supervision of other normal pregnancy, third trimester: Secondary | ICD-10-CM

## 2022-04-16 LAB — POCT URINALYSIS DIPSTICK OB
Bilirubin, UA: NEGATIVE
Bilirubin, UA: NEGATIVE
Blood, UA: NEGATIVE
Blood, UA: NEGATIVE
Glucose, UA: NEGATIVE
Glucose, UA: NEGATIVE
Ketones, UA: NEGATIVE
Ketones, UA: NEGATIVE
Leukocytes, UA: NEGATIVE
Leukocytes, UA: NEGATIVE
Nitrite, UA: NEGATIVE
Nitrite, UA: NEGATIVE
POC,PROTEIN,UA: NEGATIVE
POC,PROTEIN,UA: NEGATIVE
Spec Grav, UA: 1.01 (ref 1.010–1.025)
Spec Grav, UA: 1.01 (ref 1.010–1.025)
Urobilinogen, UA: 0.2 E.U./dL
Urobilinogen, UA: 0.2 E.U./dL
pH, UA: 6.5 (ref 5.0–8.0)
pH, UA: 7 (ref 5.0–8.0)

## 2022-04-16 NOTE — Progress Notes (Signed)
ROB [redacted]w[redacted]d: NST today. IOL 12/13@12am . Inquiring if can be changed to 12/16. Desires natural labor. Doing well. No complaints.

## 2022-04-16 NOTE — Progress Notes (Signed)
ROB: Induction scheduled for 12/13 at midnight.  NST reactive today.

## 2022-04-20 ENCOUNTER — Encounter: Payer: Self-pay | Admitting: Obstetrics and Gynecology

## 2022-04-22 ENCOUNTER — Encounter: Payer: Self-pay | Admitting: Obstetrics and Gynecology

## 2022-04-22 ENCOUNTER — Other Ambulatory Visit: Payer: Self-pay

## 2022-04-22 ENCOUNTER — Inpatient Hospital Stay
Admission: EM | Admit: 2022-04-22 | Discharge: 2022-04-25 | DRG: 788 | Disposition: A | Discharge status: Home / Self Care | Payer: Medicaid Other | Attending: Obstetrics and Gynecology | Admitting: Obstetrics and Gynecology

## 2022-04-22 DIAGNOSIS — Z7982 Long term (current) use of aspirin: Secondary | ICD-10-CM

## 2022-04-22 DIAGNOSIS — Z23 Encounter for immunization: Secondary | ICD-10-CM

## 2022-04-22 DIAGNOSIS — O1002 Pre-existing essential hypertension complicating childbirth: Secondary | ICD-10-CM | POA: Diagnosis present

## 2022-04-22 DIAGNOSIS — O368390 Maternal care for abnormalities of the fetal heart rate or rhythm, unspecified trimester, not applicable or unspecified: Secondary | ICD-10-CM | POA: Diagnosis present

## 2022-04-22 DIAGNOSIS — O10013 Pre-existing essential hypertension complicating pregnancy, third trimester: Secondary | ICD-10-CM | POA: Diagnosis not present

## 2022-04-22 DIAGNOSIS — O99214 Obesity complicating childbirth: Secondary | ICD-10-CM | POA: Diagnosis present

## 2022-04-22 DIAGNOSIS — Z3A37 37 weeks gestation of pregnancy: Secondary | ICD-10-CM

## 2022-04-22 DIAGNOSIS — Z3A39 39 weeks gestation of pregnancy: Secondary | ICD-10-CM | POA: Diagnosis not present

## 2022-04-22 DIAGNOSIS — Z3490 Encounter for supervision of normal pregnancy, unspecified, unspecified trimester: Secondary | ICD-10-CM

## 2022-04-22 LAB — TYPE AND SCREEN
ABO/RH(D): O POS
Antibody Screen: NEGATIVE

## 2022-04-22 LAB — RPR: RPR Ser Ql: NONREACTIVE

## 2022-04-22 LAB — CBC
HCT: 34.9 % — ABNORMAL LOW (ref 36.0–46.0)
Hemoglobin: 11.2 g/dL — ABNORMAL LOW (ref 12.0–15.0)
MCH: 27.2 pg (ref 26.0–34.0)
MCHC: 32.1 g/dL (ref 30.0–36.0)
MCV: 84.7 fL (ref 80.0–100.0)
Platelets: 247 10*3/uL (ref 150–400)
RBC: 4.12 MIL/uL (ref 3.87–5.11)
RDW: 15.5 % (ref 11.5–15.5)
WBC: 12.9 10*3/uL — ABNORMAL HIGH (ref 4.0–10.5)
nRBC: 0.2 % (ref 0.0–0.2)

## 2022-04-22 MED ORDER — LACTATED RINGERS IV SOLN
500.0000 mL | INTRAVENOUS | Status: DC | PRN
  Administered 2022-04-22 – 2022-04-23 (×3): 500 mL via INTRAVENOUS

## 2022-04-22 MED ORDER — ONDANSETRON HCL 4 MG/2ML IJ SOLN: 4.0000 mg | Freq: Four times a day (QID) | INTRAMUSCULAR | Status: DC | PRN

## 2022-04-22 MED ORDER — OXYTOCIN-SODIUM CHLORIDE 30-0.9 UT/500ML-% IV SOLN
1.0000 m[IU]/min | INTRAVENOUS | Status: DC
  Administered 2022-04-22 (×2): 2 m[IU]/min via INTRAVENOUS

## 2022-04-22 MED ORDER — MISOPROSTOL 50MCG HALF TABLET
50.0000 ug | ORAL_TABLET | ORAL | Status: DC | PRN
  Administered 2022-04-22: 50 ug via VAGINAL

## 2022-04-22 MED ORDER — FENTANYL CITRATE (PF) 100 MCG/2ML IJ SOLN: 50.0000 ug | INTRAMUSCULAR | Status: DC | PRN

## 2022-04-22 MED ORDER — LIDOCAINE HCL (PF) 1 % IJ SOLN: 30.0000 mL | INTRAMUSCULAR | Status: DC | PRN

## 2022-04-22 MED ORDER — TERBUTALINE SULFATE 1 MG/ML IJ SOLN: 0.2500 mg | Freq: Once | INTRAMUSCULAR | Status: DC | PRN

## 2022-04-22 MED ORDER — SOD CITRATE-CITRIC ACID 500-334 MG/5ML PO SOLN
30.0000 mL | ORAL | Status: DC | PRN
  Administered 2022-04-23: 30 mL via ORAL

## 2022-04-22 MED ORDER — MISOPROSTOL 50MCG HALF TABLET
50.0000 ug | ORAL_TABLET | Freq: Once | ORAL | Status: AC
  Administered 2022-04-22: 50 ug via ORAL
  Filled 2022-04-22: qty 1

## 2022-04-22 MED ORDER — HYDROXYZINE HCL 25 MG PO TABS: 50.0000 mg | ORAL_TABLET | Freq: Four times a day (QID) | ORAL | Status: DC | PRN

## 2022-04-22 MED ORDER — LABETALOL HCL 100 MG PO TABS
100.0000 mg | ORAL_TABLET | Freq: Two times a day (BID) | ORAL | Status: DC
  Administered 2022-04-22 – 2022-04-25 (×7): 100 mg via ORAL
  Filled 2022-04-22 (×7): qty 1

## 2022-04-22 MED ORDER — LORATADINE 10 MG PO TABS
10.0000 mg | ORAL_TABLET | Freq: Every day | ORAL | Status: DC
  Administered 2022-04-22 – 2022-04-25 (×4): 10 mg via ORAL
  Filled 2022-04-22 (×4): qty 1

## 2022-04-22 MED ORDER — LACTATED RINGERS IV SOLN: INTRAVENOUS | Status: DC

## 2022-04-22 MED ORDER — OXYTOCIN-SODIUM CHLORIDE 30-0.9 UT/500ML-% IV SOLN
2.5000 [IU]/h | INTRAVENOUS | Status: DC
  Administered 2022-04-23 (×2): 30 [IU] via INTRAVENOUS
  Filled 2022-04-22 (×2): qty 500

## 2022-04-22 MED ORDER — ACETAMINOPHEN 325 MG PO TABS: 650.0000 mg | ORAL_TABLET | ORAL | Status: DC | PRN

## 2022-04-22 MED ORDER — ALBUTEROL SULFATE (2.5 MG/3ML) 0.083% IN NEBU: 2.5000 mg | INHALATION_SOLUTION | Freq: Four times a day (QID) | RESPIRATORY_TRACT | Status: DC | PRN

## 2022-04-22 MED ORDER — OXYTOCIN BOLUS FROM INFUSION: 333.0000 mL | Freq: Once | INTRAVENOUS | Status: DC

## 2022-04-22 MED ORDER — MISOPROSTOL 50MCG HALF TABLET
50.0000 ug | ORAL_TABLET | Freq: Once | ORAL | Status: DC
  Filled 2022-04-22: qty 1

## 2022-04-22 NOTE — Progress Notes (Signed)
  Labor Progress Note   21 y.o. G2P0010 @ [redacted]w[redacted]d , admitted for  Pregnancy, Labor Management. cHTN  Subjective:  She reports contraction frequency and intensity have decreased. Reviewed labor plan of care- start pitocin.  Objective:  BP (!) 144/79 (BP Location: Left Arm)   Pulse 74   Temp 98.2 F (36.8 C) (Oral)   Resp 18   Ht 5\' 4"  (1.626 m)   Wt (!) 137 kg   LMP 07/22/2021 (Exact Date)   SpO2 100%   BMI 51.84 kg/m  Abd: gravid, ND, FHT present, mild tenderness on exam Extr: trace to 1+ bilateral pedal edema SVE: CERVIX: 0 cm dilated, 60-70 effaced, -3, -2 station  EFM: FHR: 140 bpm, variability: moderate,  accelerations:  Present,  decelerations:  Absent Toco: Frequency: Every 4-8 minutes Labs: I have reviewed the patient's lab results.   Assessment & Plan:  G2P0010 @ [redacted]w[redacted]d, admitted for  Pregnancy and Labor/Delivery Management  1. Pain management:  position changes . 2. FWB: FHT category I.  3. ID: GBS negative 4. Labor management: start pitocin  All discussed with patient, see orders   [redacted]w[redacted]d, CNM  Ob/Gyn [facility] 04/22/2022  2:13 PM

## 2022-04-22 NOTE — Progress Notes (Signed)
LABOR NOTE   SUBJECTIVE:   Joyce Smith is a 21 y.o.  G2P0010  at [redacted]w[redacted]d who is on L&D for cervical ripening and induction of labor for [facility]. She has received one dose of misoprostol. She has felt some occasional cramping. Her cervix is unchanged from the previous exam. Desires medicine for rest.   Analgesia: Labor support without medications  OBJECTIVE:  BP 129/75 (BP Location: Left Arm)   Pulse 82   Ht 5\' 4"  (1.626 m)   Wt (!) 137 kg   LMP 07/22/2021 (Exact Date)   SpO2 100%   BMI 51.84 kg/m  No intake/output data recorded.  SVE:   Dilation: Fingertip Effacement (%): Thick Station: -3 Exam by:: [email or web address], CNM CONTRACTIONS: irregular, every 2-5 minutes FHR: Fetal heart tracing reviewed. Baseline: 130 Variability: moderate Accelerations: present Decelerations:none Category 1  Labs: Lab Results  Component Value Date   WBC 12.9 (H) 04/22/2022   HGB 11.2 (L) 04/22/2022   HCT 34.9 (L) 04/22/2022   MCV 84.7 04/22/2022   PLT 247 04/22/2022    ASSESSMENT: 1)  IOL for CHTN, cervical ripening     Coping: well     Membranes: intact   Principal Problem:   Term pregnancy   CHTN  PLAN: Continue cervical ripening Consider Cook's catheter or Foley bulb when appropriate Hydroxizine for rest Anticipate NSVD   04/24/2022, CNM 04/22/2022 5:45 AM

## 2022-04-22 NOTE — Progress Notes (Signed)
  Labor Progress Note   21 y.o. G2P0010 @ [redacted]w[redacted]d , admitted for  Pregnancy, Labor Management. cHTN  Subjective:  Coping ok with contractions using position changes and birthing ball. She is reporting 8/10 pain. Reviewed plan of care- attempt to place foley bulb (still having regular contractions- hold dose of cytotec).  Objective:  BP (!) 130/57 (BP Location: Left Arm)   Pulse 84   Temp 98.7 F (37.1 C) (Oral)   Resp 18   Ht 5\' 4"  (1.626 m)   Wt (!) 137 kg   LMP 07/22/2021 (Exact Date)   SpO2 100%   BMI 51.84 kg/m  Abd: gravid, ND, FHT present, mild tenderness on exam Extr: trace to 1+ bilateral pedal edema SVE: CERVIX: 0 cm dilated (external os 1 cm), 50 effaced, -3 station Unable to manually dilate cervix to 1 cm  EFM: FHR: 145 bpm, variability: moderate,  accelerations:  Present,  decelerations:  Absent Toco: Frequency: Every 1-3 minutes Labs: I have reviewed the patient's lab results.   Assessment & Plan:  G2P0010 @ [redacted]w[redacted]d, admitted for  Pregnancy and Labor/Delivery Management  1. Pain management:  position changes . 2. FWB: FHT category I.  3. ID: GBS negative 4. Labor management: Redose cytotec if contractions space out, consider foley bulb if needed/able to place at next check  All discussed with patient, see orders   [redacted]w[redacted]d, CNM [place] [facility] 04/22/2022  9:54 AM

## 2022-04-22 NOTE — Progress Notes (Signed)
  Labor Progress Note   21 y.o. G2P0010 @ [redacted]w[redacted]d , admitted for  Pregnancy, Labor Management. cHTN  Subjective:  Contractions feeling like cramps.   Objective:  BP 129/73 (BP Location: Left Arm)   Pulse 85   Temp 98.2 F (36.8 C) (Oral)   Resp 18   Ht 5\' 4"  (1.626 m)   Wt (!) 137 kg   LMP 07/22/2021 (Exact Date)   SpO2 100%   BMI 51.84 kg/m  Abd: gravid, ND, FHT present, mild tenderness on exam Extr: trace to 1+ bilateral pedal edema SVE: CERVIX: 0.5 cm dilated, 60 effaced, -3 station, posterior Difficult exam due to body habitus  EFM: FHR: 145 bpm, variability: moderate,  accelerations:  Present,  decelerations:  Present occasional late Toco: Frequency: Every 3-4 minutes Labs: I have reviewed the patient's lab results.   Assessment & Plan:  G2P0010 @ [redacted]w[redacted]d, admitted for  Pregnancy and Labor/Delivery Management  1. Pain management:  position changes . 2. FWB: FHT category I/II.  3. ID: GBS negative 4. Labor management: continue pitocin titration  All discussed with patient, see orders   [redacted]w[redacted]d, CNM [facility] 04/22/2022  8:47 PM
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
## 2022-04-23 ENCOUNTER — Encounter: Payer: Self-pay | Admitting: Obstetrics and Gynecology

## 2022-04-23 ENCOUNTER — Other Ambulatory Visit: Payer: Self-pay

## 2022-04-23 ENCOUNTER — Inpatient Hospital Stay: Payer: Medicaid Other | Admitting: General Practice

## 2022-04-23 ENCOUNTER — Encounter
Admission: EM | Disposition: A | Discharge status: Home / Self Care | Payer: Self-pay | Source: Home / Self Care | Attending: Obstetrics and Gynecology

## 2022-04-23 DIAGNOSIS — Z3A39 39 weeks gestation of pregnancy: Secondary | ICD-10-CM

## 2022-04-23 DIAGNOSIS — O368390 Maternal care for abnormalities of the fetal heart rate or rhythm, unspecified trimester, not applicable or unspecified: Secondary | ICD-10-CM | POA: Diagnosis present

## 2022-04-23 DIAGNOSIS — O10013 Pre-existing essential hypertension complicating pregnancy, third trimester: Secondary | ICD-10-CM

## 2022-04-23 SURGERY — Surgical Case
Anesthesia: Spinal

## 2022-04-23 MED ORDER — DIPHENHYDRAMINE HCL 50 MG/ML IJ SOLN: 12.5000 mg | INTRAMUSCULAR | Status: DC | PRN

## 2022-04-23 MED ORDER — OXYCODONE-ACETAMINOPHEN 5-325 MG PO TABS
1.0000 | ORAL_TABLET | ORAL | Status: DC | PRN
  Filled 2022-04-23: qty 2

## 2022-04-23 MED ORDER — LACTATED RINGERS IV SOLN: INTRAVENOUS | Status: DC

## 2022-04-23 MED ORDER — ZOLPIDEM TARTRATE 5 MG PO TABS: 5.0000 mg | ORAL_TABLET | Freq: Every evening | ORAL | Status: DC | PRN

## 2022-04-23 MED ORDER — MENTHOL 3 MG MT LOZG: 1.0000 | LOZENGE | OROMUCOSAL | Status: DC | PRN

## 2022-04-23 MED ORDER — LIDOCAINE HCL (PF) 1 % IJ SOLN
INTRAMUSCULAR | Status: DC | PRN
  Administered 2022-04-23: 3 mL via SUBCUTANEOUS
  Administered 2022-04-23: 2 mL via SUBCUTANEOUS

## 2022-04-23 MED ORDER — PHENYLEPHRINE HCL-NACL 20-0.9 MG/250ML-% IV SOLN
INTRAVENOUS | Status: DC | PRN
  Administered 2022-04-23: 50 ug/min via INTRAVENOUS

## 2022-04-23 MED ORDER — SODIUM CHLORIDE 0.9 % IV SOLN
500.0000 mg | INTRAVENOUS | Status: AC
  Administered 2022-04-23: 500 mg via INTRAVENOUS
  Filled 2022-04-23: qty 5

## 2022-04-23 MED ORDER — ONDANSETRON HCL 4 MG/2ML IJ SOLN
INTRAMUSCULAR | Status: AC
  Filled 2022-04-23: qty 2

## 2022-04-23 MED ORDER — KETOROLAC TROMETHAMINE 30 MG/ML IJ SOLN
INTRAMUSCULAR | Status: AC
  Filled 2022-04-23: qty 1

## 2022-04-23 MED ORDER — OXYTOCIN-SODIUM CHLORIDE 30-0.9 UT/500ML-% IV SOLN
2.5000 [IU]/h | INTRAVENOUS | Status: AC
  Administered 2022-04-23: 2.5 [IU]/h via INTRAVENOUS

## 2022-04-23 MED ORDER — SIMETHICONE 80 MG PO CHEW
80.0000 mg | CHEWABLE_TABLET | Freq: Four times a day (QID) | ORAL | Status: DC
  Administered 2022-04-23 – 2022-04-25 (×7): 80 mg via ORAL
  Filled 2022-04-23 (×7): qty 1

## 2022-04-23 MED ORDER — ONDANSETRON HCL 4 MG/2ML IJ SOLN
INTRAMUSCULAR | Status: DC | PRN
  Administered 2022-04-23: 4 mg via INTRAVENOUS

## 2022-04-23 MED ORDER — SENNOSIDES-DOCUSATE SODIUM 8.6-50 MG PO TABS
2.0000 | ORAL_TABLET | ORAL | Status: DC
  Administered 2022-04-24: 2 via ORAL
  Filled 2022-04-23: qty 2

## 2022-04-23 MED ORDER — MORPHINE SULFATE (PF) 0.5 MG/ML IJ SOLN: INTRAMUSCULAR | Status: DC | PRN

## 2022-04-23 MED ORDER — MORPHINE SULFATE (PF) 0.5 MG/ML IJ SOLN
INTRAMUSCULAR | Status: DC | PRN
  Administered 2022-04-23: 10 ug via INTRATHECAL

## 2022-04-23 MED ORDER — SODIUM CHLORIDE 0.9% FLUSH: 3.0000 mL | INTRAVENOUS | Status: DC | PRN

## 2022-04-23 MED ORDER — PRENATAL MULTIVITAMIN CH
1.0000 | ORAL_TABLET | Freq: Every day | ORAL | Status: DC
  Administered 2022-04-24: 1 via ORAL
  Filled 2022-04-23: qty 1

## 2022-04-23 MED ORDER — OXYCODONE HCL 5 MG PO TABS: 5.0000 mg | ORAL_TABLET | Freq: Four times a day (QID) | ORAL | Status: DC | PRN

## 2022-04-23 MED ORDER — DIPHENHYDRAMINE HCL 25 MG PO CAPS: 25.0000 mg | ORAL_CAPSULE | Freq: Four times a day (QID) | ORAL | Status: DC | PRN

## 2022-04-23 MED ORDER — FENTANYL CITRATE (PF) 100 MCG/2ML IJ SOLN
INTRAMUSCULAR | Status: AC
  Filled 2022-04-23: qty 2

## 2022-04-23 MED ORDER — CHLORHEXIDINE GLUCONATE 0.12 % MT SOLN
OROMUCOSAL | Status: AC
  Administered 2022-04-23: 15 mL
  Filled 2022-04-23: qty 15

## 2022-04-23 MED ORDER — SCOPOLAMINE 1 MG/3DAYS TD PT72: 1.0000 | MEDICATED_PATCH | Freq: Once | TRANSDERMAL | Status: DC

## 2022-04-23 MED ORDER — BUPIVACAINE IN DEXTROSE 0.75-8.25 % IT SOLN
INTRATHECAL | Status: DC | PRN
  Administered 2022-04-23: 1.5 mL via INTRATHECAL

## 2022-04-23 MED ORDER — SOD CITRATE-CITRIC ACID 500-334 MG/5ML PO SOLN
ORAL | Status: AC
  Filled 2022-04-23: qty 15

## 2022-04-23 MED ORDER — ONDANSETRON HCL 4 MG/2ML IJ SOLN: 4.0000 mg | Freq: Three times a day (TID) | INTRAMUSCULAR | Status: DC | PRN

## 2022-04-23 MED ORDER — CEFAZOLIN IN SODIUM CHLORIDE 3-0.9 GM/100ML-% IV SOLN
3.0000 g | INTRAVENOUS | Status: AC
  Administered 2022-04-23: 3 g via INTRAVENOUS
  Filled 2022-04-23: qty 100

## 2022-04-23 MED ORDER — FENTANYL CITRATE (PF) 100 MCG/2ML IJ SOLN
INTRAMUSCULAR | Status: DC | PRN
  Administered 2022-04-23: 15 ug via INTRATHECAL

## 2022-04-23 MED ORDER — NALOXONE HCL 4 MG/10ML IJ SOLN: 1.0000 ug/kg/h | INTRAVENOUS | Status: DC | PRN

## 2022-04-23 MED ORDER — MEPERIDINE HCL 25 MG/ML IJ SOLN: 6.2500 mg | INTRAMUSCULAR | Status: DC | PRN

## 2022-04-23 MED ORDER — KETOROLAC TROMETHAMINE 30 MG/ML IJ SOLN
INTRAMUSCULAR | Status: DC | PRN
  Administered 2022-04-23: 30 mg via INTRAVENOUS

## 2022-04-23 MED ORDER — ACETAMINOPHEN 500 MG PO TABS
1000.0000 mg | ORAL_TABLET | Freq: Four times a day (QID) | ORAL | Status: AC
  Administered 2022-04-23 – 2022-04-24 (×4): 1000 mg via ORAL
  Filled 2022-04-23 (×4): qty 2

## 2022-04-23 MED ORDER — POVIDONE-IODINE 10 % EX SWAB
2.0000 | Freq: Once | CUTANEOUS | Status: DC
  Administered 2022-04-23: 2 via TOPICAL

## 2022-04-23 MED ORDER — KETOROLAC TROMETHAMINE 30 MG/ML IJ SOLN
30.0000 mg | Freq: Four times a day (QID) | INTRAMUSCULAR | Status: AC | PRN
  Administered 2022-04-24: 30 mg via INTRAVENOUS
  Filled 2022-04-23: qty 1

## 2022-04-23 MED ORDER — NALOXONE HCL 0.4 MG/ML IJ SOLN: 0.4000 mg | INTRAMUSCULAR | Status: DC | PRN

## 2022-04-23 MED ORDER — IBUPROFEN 600 MG PO TABS
600.0000 mg | ORAL_TABLET | Freq: Four times a day (QID) | ORAL | Status: DC
  Administered 2022-04-23 – 2022-04-25 (×7): 600 mg via ORAL
  Filled 2022-04-23 (×7): qty 1

## 2022-04-23 MED ORDER — KETOROLAC TROMETHAMINE 30 MG/ML IJ SOLN: 30.0000 mg | Freq: Four times a day (QID) | INTRAMUSCULAR | Status: AC | PRN

## 2022-04-23 MED ORDER — LIDOCAINE 5 % EX PTCH
MEDICATED_PATCH | CUTANEOUS | Status: AC
  Filled 2022-04-23: qty 1

## 2022-04-23 MED ORDER — MORPHINE SULFATE (PF) 0.5 MG/ML IJ SOLN
INTRAMUSCULAR | Status: AC
  Filled 2022-04-23: qty 10

## 2022-04-23 MED ORDER — FENTANYL CITRATE (PF) 100 MCG/2ML IJ SOLN: INTRAMUSCULAR | Status: DC | PRN

## 2022-04-23 MED ORDER — DIPHENHYDRAMINE HCL 25 MG PO CAPS: 25.0000 mg | ORAL_CAPSULE | ORAL | Status: DC | PRN

## 2022-04-23 SURGICAL SUPPLY — 28 items
ADH LQ OCL WTPRF AMP STRL LF (MISCELLANEOUS) ×1
ADHESIVE MASTISOL STRL (MISCELLANEOUS) ×1 IMPLANT
APL PRP STRL LF DISP 70% ISPRP (MISCELLANEOUS) ×2
BAG COUNTER SPONGE SURGICOUNT (BAG) ×1 IMPLANT
BAG SPNG CNTER NS LX DISP (BAG) ×1
CHLORAPREP W/TINT 26 (MISCELLANEOUS) ×2 IMPLANT
DRSG TELFA 3X8 NADH STRL (GAUZE/BANDAGES/DRESSINGS) ×1 IMPLANT
GAUZE SPONGE 4X4 12PLY STRL (GAUZE/BANDAGES/DRESSINGS) ×1 IMPLANT
GLOVE PI ORTHO PRO STRL 7.5 (GLOVE) ×1 IMPLANT
GOWN STRL REUS W/ TWL LRG LVL3 (GOWN DISPOSABLE) ×2 IMPLANT
GOWN STRL REUS W/TWL LRG LVL3 (GOWN DISPOSABLE) ×2
KIT TURNOVER KIT A (KITS) ×1 IMPLANT
MANIFOLD NEPTUNE II (INSTRUMENTS) ×1 IMPLANT
MAT PREVALON FULL STRYKER (MISCELLANEOUS) ×1 IMPLANT
NS IRRIG 1000ML POUR BTL (IV SOLUTION) ×1 IMPLANT
PACK C SECTION AR (MISCELLANEOUS) ×1 IMPLANT
PAD OB MATERNITY 4.3X12.25 (PERSONAL CARE ITEMS) ×1 IMPLANT
PAD PREP 24X41 OB/GYN DISP (PERSONAL CARE ITEMS) ×1 IMPLANT
RETRACTOR WND ALEXIS-O 25 LRG (MISCELLANEOUS) ×1 IMPLANT
RTRCTR WOUND ALEXIS O 25CM LRG (MISCELLANEOUS) ×1
SCRUB CHG 4% DYNA-HEX 4OZ (MISCELLANEOUS) ×1 IMPLANT
SPONGE T-LAP 18X18 ~~LOC~~+RFID (SPONGE) ×1 IMPLANT
SUT VIC AB 0 CTX 36 (SUTURE) ×2
SUT VIC AB 0 CTX36XBRD ANBCTRL (SUTURE) ×2 IMPLANT
SUT VIC AB 1 CT1 36 (SUTURE) ×2 IMPLANT
SUT VICRYL+ 3-0 36IN CT-1 (SUTURE) ×2 IMPLANT
TRAP FLUID SMOKE EVACUATOR (MISCELLANEOUS) ×1 IMPLANT
WATER STERILE IRR 500ML POUR (IV SOLUTION) ×1 IMPLANT

## 2022-04-23 NOTE — Anesthesia Procedure Notes (Addendum)
Spinal  Patient location during procedure: OR Start time: 04/23/2022 10:35 AM End time: 04/23/2022 10:42 AM Reason for block: surgical anesthesia Staffing Performed: resident/CRNA and other anesthesia staff  Resident/CRNA: Karoline Caldwell, CRNA Other anesthesia staff: Jasper Loser, RN Performed by: Karoline Caldwell, CRNA Authorized by: Stephanie Coup, MD   Preanesthetic Checklist Completed: patient identified, IV checked, site marked, risks and benefits discussed, surgical consent, monitors and equipment checked, pre-op evaluation and timeout performed Spinal Block Patient position: sitting Prep: DuraPrep Patient monitoring: heart rate, cardiac monitor, continuous pulse ox and blood pressure Approach: midline Location: L3-4 Injection technique: single-shot Needle Needle type: Pencan  Needle gauge: 24 G Needle length: 9 cm Assessment Sensory level: T4 Events: CSF return Additional Notes 1st attempt by SRNA.

## 2022-04-23 NOTE — Progress Notes (Signed)
Patient coping ok through contractions. No change in cervical exam per RN K. Cevallos. Contractions every 1 -4 Decelerations occur when pitocin is above 8 mu/min  Joyce Smith, CNM

## 2022-04-23 NOTE — Op Note (Signed)
     OP NOTE  Date: 04/23/2022   1:51 PM Name Joyce Smith MR# [id number]  Preoperative Diagnosis: 1. Intrauterine pregnancy at [redacted]w[redacted]d Principal Problem:   Term pregnancy Active Problems:   Non-reassuring fetal heart rate with late deceleration    Remote from delivery.  2.  failed induction  Postoperative Diagnosis: 1. Intrauterine pregnancy at [redacted]w[redacted]d, delivered 2. Viable infant 3. Remainder same as pre-op   Procedure: 1. Primary Low-Transverse Cesarean Section  Surgeon: Elonda Husky, MD  Assistant:  Raeford Razor CNM  Anesthesia: Spinal    EBL: 425  ml     Findings: 1) female infant, Apgar scores of 8   at 1 minute and 9   at 5 minutes and a birthweight of 107.58  ounces.    2) Normal uterus, tubes and ovaries.    Procedure:  The patient was prepped and draped in the supine position and placed under spinal anesthesia.  A transverse incision was made across the abdomen in a Pfannenstiel manner. If indicated the old scar was systematically removed with sharp dissection.  We carried the dissection down to the level of the fascia.  The fascia was incised in a curvilinear manner.  The fascia was then elevated from the rectus muscles with blunt and sharp dissection.  The rectus muscles were separated laterally exposing the peritoneum.  The peritoneum was carefully entered with care being taken to avoid bowel and bladder.  A self-retaining retractor was placed.  The visceral peritoneum was incised in a curvilinear fashion across the lower uterine segment creating a bladder flap. A transverse incision was made across the lower uterine segment and extended laterally and superiorly using the bandage scissors.  Artificial rupture membranes was performed and Clear fluid was noted.  The infant was delivered from the cephalic position.  A nuchal cord was not present. After an appropriate time interval, the cord was doubly clamped and cut. Cord blood was obtained if required.  The  infant was handed to the pediatric personnel  who then placed the infant under heat lamps where it was cleaned dried and suctioned as needed. The placenta was delivered. The hysterotomy incision was then identified on ring forceps.  The uterine cavity was cleaned with a moist lap sponge.  The hysterotomy incision was closed with a running interlocking suture of Vicryl.  Hemostasis was excellent.  Pitocin was run in the IV and the uterus was found to be firm. The posterior cul-de-sac and gutters were cleaned and inspected.  Hemostasis was noted.  The fascia was then closed with a running suture of #1 Vicryl.  Hemostasis of the subcutaneous tissues was obtained using the Bovie.  The subcutaneous tissues were closed with a running suture of 000 Vicryl.  A subcuticular suture was placed.  Steri-strips were applied in the usual manner.  A Lidoderm patch was applied.  A pressure dressing was placed.  The patient went to the recovery room in stable condition.   Elonda Husky, M.D. 04/23/2022 1:51 PM

## 2022-04-23 NOTE — Progress Notes (Signed)
  Labor Progress Note   21 y.o. G2P0010 @ [redacted]w[redacted]d , admitted for  Pregnancy, Labor Management. cHTN  Subjective:  Patient reports feeling stronger contractions. She has been feeling constipated and has been up to the bathroom throughout the night.  Objective:  BP (!) 140/87 (BP Location: Left Arm)   Pulse 87   Temp 98.1 F (36.7 C) (Oral)   Resp 16   Ht 5\' 4"  (1.626 m)   Wt (!) 137 kg   LMP 07/22/2021 (Exact Date)   SpO2 100%   BMI 51.84 kg/m  Abd: gravid, ND, FHT present, mild tenderness on exam Extr: trace to 1+ bilateral pedal edema SVE: CERVIX: 0.5 cm dilated, 60 effaced, -3 station  EFM: FHR: 150 bpm, variability: minimal to moderate,  accelerations:  Present,  decelerations:  Present late decelerations Toco: Frequency: Every 2-4 minutes (difficulty tracing at times due to position changes) Labs: I have reviewed the patient's lab results.   Assessment & Plan:  G2P0010 @ [redacted]w[redacted]d, admitted for  Pregnancy and Labor/Delivery Management  1. Pain management:  position changes . 2. FWB: FHT category I/II.  3. ID: GBS negative 4. Labor management: Message sent to Dr [redacted]w[redacted]d regarding monitoring/cervical exam. Awaiting management plan. Will discuss with patient.  All discussed with patient, see orders   Logan Bores, CNM [facility] 04/23/2022  6:13 AM

## 2022-04-23 NOTE — Anesthesia Preprocedure Evaluation (Signed)
Anesthesia Evaluation  Patient identified by MRN, date of birth, ID band Patient awake    Reviewed: Allergy & Precautions, NPO status , Patient's Chart, lab work & pertinent test results  History of Anesthesia Complications Negative for: history of anesthetic complications  Airway Mallampati: IV  TM Distance: >3 FB Neck ROM: full    Dental  (+) Dental Advidsory Given, Teeth Intact   Pulmonary neg shortness of breath, asthma , neg sleep apnea   Pulmonary exam normal        Cardiovascular Exercise Tolerance: Good hypertension, On Medications (-) angina (-) Past MI and (-) CABG negative cardio ROS Normal cardiovascular exam     Neuro/Psych  PSYCHIATRIC DISORDERS         GI/Hepatic negative GI ROS,,,  Endo/Other    Renal/GU   negative genitourinary   Musculoskeletal   Abdominal   Peds  Hematology negative hematology ROS (+)   Anesthesia Other Findings Past Medical History: No date: Anxiety No date: Asthma No date: Depression No date: Elevated blood pressure reading No date: Heart murmur No date: Hypertension No date: Seasonal allergies  Past Surgical History: No date: NONE  BMI    Body Mass Index: 51.84 kg/m      Reproductive/Obstetrics (+) Pregnancy                             Anesthesia Physical Anesthesia Plan  ASA: 3  Anesthesia Plan: Spinal   Post-op Pain Management:    Induction:   PONV Risk Score and Plan: 3 and Ondansetron and Dexamethasone  Airway Management Planned: Natural Airway and Nasal Cannula  Additional Equipment:   Intra-op Plan:   Post-operative Plan:   Informed Consent: I have reviewed the patients History and Physical, chart, labs and discussed the procedure including the risks, benefits and alternatives for the proposed anesthesia with the patient or authorized representative who has indicated his/her understanding and acceptance.      Dental Advisory Given  Plan Discussed with: Anesthesiologist, CRNA and Surgeon  Anesthesia Plan Comments: (Patient reports no bleeding problems and no anticoagulant use.  Plan for spinal with backup GA  Patient consented for risks of anesthesia including but not limited to:  - adverse reactions to medications - damage to eyes, teeth, lips or other oral mucosa - nerve damage due to positioning  - risk of bleeding, infection and or nerve damage from spinal that could lead to paralysis - risk of headache or failed spinal - damage to teeth, lips or other oral mucosa - sore throat or hoarseness - damage to heart, brain, nerves, lungs, other parts of body or loss of life  Patient voiced understanding.)       Anesthesia Quick Evaluation

## 2022-04-23 NOTE — Transfer of Care (Signed)
Immediate Anesthesia Transfer of Care Note  Patient: Joyce Smith  Procedure(s) Performed: CESAREAN SECTION  Patient Location: L&D Room 3  Anesthesia Type:Spinal  Level of Consciousness: awake  Airway & Oxygen Therapy: Patient Spontanous Breathing  Post-op Assessment: Report given to RN and Post -op Vital signs reviewed and stable  Post vital signs: Reviewed and stable  Last Vitals:  Vitals Value Taken Time  BP 146/51 1226  Temp    Pulse 72 1226  Resp 19 1226  SpO2 100 1226    Last Pain:  Vitals:   04/23/22 0745  TempSrc: Oral  PainSc: 0-No pain      Patients Stated Pain Goal: 0 (04/22/22 1950)  Complications: No notable events documented.

## 2022-04-24 ENCOUNTER — Encounter: Payer: Self-pay | Admitting: Obstetrics and Gynecology

## 2022-04-24 LAB — CBC
HCT: 32.5 % — ABNORMAL LOW (ref 36.0–46.0)
Hemoglobin: 10.4 g/dL — ABNORMAL LOW (ref 12.0–15.0)
MCH: 27.2 pg (ref 26.0–34.0)
MCHC: 32 g/dL (ref 30.0–36.0)
MCV: 84.9 fL (ref 80.0–100.0)
Platelets: 215 10*3/uL (ref 150–400)
RBC: 3.83 MIL/uL — ABNORMAL LOW (ref 3.87–5.11)
RDW: 15.2 % (ref 11.5–15.5)
WBC: 10.8 10*3/uL — ABNORMAL HIGH (ref 4.0–10.5)
nRBC: 0 % (ref 0.0–0.2)

## 2022-04-24 MED ORDER — OXYCODONE HCL 5 MG PO TABS: 5.0000 mg | ORAL_TABLET | ORAL | Status: DC | PRN

## 2022-04-24 MED ORDER — ACETAMINOPHEN 500 MG PO TABS
1000.0000 mg | ORAL_TABLET | Freq: Four times a day (QID) | ORAL | Status: DC | PRN
  Administered 2022-04-24 – 2022-04-25 (×4): 1000 mg via ORAL
  Filled 2022-04-24 (×4): qty 2

## 2022-04-24 MED ORDER — IBUPROFEN 800 MG PO TABS: 800.0000 mg | ORAL_TABLET | Freq: Three times a day (TID) | ORAL | Status: DC | PRN

## 2022-04-24 MED ORDER — ACETAMINOPHEN 500 MG PO TABS
ORAL_TABLET | ORAL | Status: AC
  Filled 2022-04-24: qty 2

## 2022-04-24 NOTE — Anesthesia Postprocedure Evaluation (Signed)
Anesthesia Post Note  Patient: Joyce Smith  Procedure(s) Performed: CESAREAN SECTION  Patient location during evaluation: Mother Baby Anesthesia Type: Spinal Level of consciousness: oriented and awake and alert Pain management: pain level controlled Vital Signs Assessment: post-procedure vital signs reviewed and stable Respiratory status: spontaneous breathing and respiratory function stable Cardiovascular status: blood pressure returned to baseline and stable Postop Assessment: no headache, no backache, no apparent nausea or vomiting and able to ambulate Anesthetic complications: no   No notable events documented.   Last Vitals:  Vitals:   04/24/22 0403 04/24/22 0747  BP: 123/64 113/73  Pulse: 75   Resp: 16 18  Temp: 36.9 C 36.9 C  SpO2: 99% 99%    Last Pain:  Vitals:   04/24/22 0747  TempSrc: Oral  PainSc:                  Rica Mast

## 2022-04-24 NOTE — Progress Notes (Signed)
Progress Note - Cesarean Delivery  Joyce Smith is a 21 y.o. G2P1011 now PP day 1 s/p C-Section, Low Transverse .   Subjective:  Patient reports no problems with eating, bowel movements, voiding, or their wound    Objective:  Vital signs in last 24 hours: Temp:  [97.9 F (36.6 C)-99.1 F (37.3 C)] 98.5 F (36.9 C) (12/15 0747) Pulse Rate:  [72-86] 75 (12/15 0403) Resp:  [0-37] 18 (12/15 0747) BP: (113-162)/(50-80) 113/73 (12/15 0747) SpO2:  [98 %-100 %] 99 % (12/15 0747)  Physical Exam:  General: alert, cooperative, and no distress Lochia: appropriate Uterine Fundus: firm Incision: dressing intact    Data Review Recent Labs    04/22/22 0045 04/24/22 0335  HGB 11.2* 10.4*  HCT 34.9* 32.5*    Assessment:  Principal Problem:   Term pregnancy Active Problems:   Non-reassuring fetal heart rate with late deceleration   Status post Cesarean section. Doing well postoperatively.     Plan:       Continue current care.    Elonda Husky, M.D. 04/24/2022 7:50 AM

## 2022-04-24 NOTE — Anesthesia Post-op Follow-up Note (Signed)
  Anesthesia Pain Follow-up Note  Patient: Joyce Smith  Day #: 1  Date of Follow-up: 04/24/2022 Time: 8:24 AM  Last Vitals:  Vitals:   04/24/22 0403 04/24/22 0747  BP: 123/64 113/73  Pulse: 75   Resp: 16 18  Temp: 36.9 C 36.9 C  SpO2: 99% 99%    Level of Consciousness: alert  Pain: none   Side Effects:None  Catheter Site Exam:clean, dry, no drainage     Plan: D/C from anesthesia care at surgeon's request  Rica Mast

## 2022-04-25 MED ORDER — VARICELLA VIRUS VACCINE LIVE 1350 PFU/0.5ML IJ SUSR
0.5000 mL | Freq: Once | INTRAMUSCULAR | Status: AC
  Administered 2022-04-25: 0.5 mL via SUBCUTANEOUS
  Filled 2022-04-25: qty 0.5

## 2022-04-25 NOTE — Discharge Summary (Addendum)
Postpartum Discharge Summary  Date of Service updated 04/25/22     Patient Name: Joyce Smith DOB: [birth year] MRN: [id number]  Date of admission: 04/22/2022 Delivery date:04/23/2022  Delivering provider: Harlin Heys  Date of discharge: 04/25/2022  Admitting diagnosis: Term pregnancy [Z34.90] Non-reassuring fetal heart rate with late deceleration [O36.8390] Intrauterine pregnancy: [redacted]w[redacted]d    Secondary diagnosis:  Principal Problem:   Term pregnancy Active Problems:   Non-reassuring fetal heart rate with late deceleration  Additional problems: CHTN    Discharge diagnosis: Term Pregnancy Delivered and CHTN                                              Post partum procedures: none Augmentation: Pitocin and Cytotec Complications: Non-reassuring FHR  Hospital course: Induction of Labor With Cesarean Section   21y.o. yo G2P1011 at 366w2das admitted to the hospital 04/22/2022 for induction of labor. Patient had a labor course significant for non-reassuring fetal status remote from delivery. The patient went for cesarean section due to Non-Reassuring FHR. Delivery details are as follows: Membrane Rupture Time/Date: 11:17 AM ,04/23/2022   Delivery Method:C-Section, Low Transverse  Details of operation can be found in separate operative Note.  Patient had an uncomplicated postpartum course. She is ambulating, tolerating a regular diet, passing flatus, and urinating well.  Patient is discharged home in stable condition on 04/25/22.      Newborn Data: Birth date:04/23/2022  Birth time:11:18 AM  Gender:Female  Living status:Living  Apgars:8 ,9  Weight:3050 g                                Magnesium Sulfate received: No BMZ received: No Rhophylac:N/A MMR: immune T-DaP:Given prenatally Flu: No Transfusion:No  Physical exam  Vitals:   04/24/22 2324 04/25/22 0342 04/25/22 0809 04/25/22 1053  BP: 139/79 (!) 117/51 134/62 (!) 152/74  Pulse: 88 81 98 85  Resp: _0 Temp: 97.8 F (36.6 C) 97.7 F (36.5 C) 97.9 F (36.6 C)   TempSrc: Oral Oral Oral   SpO2: 100% 99% 98% 100%  Weight:      Height:       General: alert, cooperative, and no distress Heart: RRR, no murmur Lungs: CTAB Abdomen: soft, non-tender, normal BS Lochia: appropriate Uterine Fundus: firm Incision: Healing well with no significant drainage, No significant erythema, Dressing is clean, dry, and intact DVT Evaluation: No evidence of DVT seen on physical exam. Labs: Lab Results  Component Value Date   WBC 10.8 (H) 04/24/2022   HGB 10.4 (L) 04/24/2022   HCT 32.5 (L) 04/24/2022   MCV 84.9 04/24/2022   PLT 215 04/24/2022      Latest Ref Rng & Units 11/07/2021    4:10 PM  CMP  Glucose 70 - 99 mg/dL 89   BUN 6 - 20 mg/dL 8   Creatinine 0.44 - 1.00 mg/dL 0.53   Sodium 135 - 145 mmol/L 135   Potassium 3.5 - 5.1 mmol/L 3.9   Chloride 98 - 111 mmol/L 106   CO2 22 - 32 mmol/L 21   Calcium 8.9 - 10.3 mg/dL 9.1    Edinburgh Score:    04/23/2022   10:29 PM  Edinburgh Postnatal Depression Scale Screening Tool  I have been able to laugh and  see the funny side of things. 0  I have looked forward with enjoyment to things. 0  I have blamed myself unnecessarily when things went wrong. 1  I have been anxious or worried for no good reason. 0  I have felt scared or panicky for no good reason. 0  Things have been getting on top of me. 0  I have been so unhappy that I have had difficulty sleeping. 0  I have felt sad or miserable. 0  I have been so unhappy that I have been crying. 0  The thought of harming myself has occurred to me. 0  Edinburgh Postnatal Depression Scale Total 1      After visit meds:  Allergies as of 04/25/2022   No Known Allergies      Medication List     STOP taking these medications    aspirin EC 81 MG tablet       TAKE these medications    albuterol 108 (90 Base) MCG/ACT inhaler Commonly known as: VENTOLIN HFA Inhale 2 puffs into  the lungs every 6 (six) hours as needed for wheezing or shortness of breath.   budesonide-formoterol 80-4.5 MCG/ACT inhaler Commonly known as: Symbicort Inhale 2 puffs into the lungs daily.   ferrous sulfate 324 (65 Fe) MG Tbec Take by mouth.   fexofenadine 180 MG tablet Commonly known as: ALLEGRA Take 180 mg by mouth daily.   labetalol 100 MG tablet Commonly known as: NORMODYNE Take 1 tablet (100 mg total) by mouth 2 (two) times daily.   montelukast 10 MG tablet Commonly known as: SINGULAIR TAKE 1 TABLET BY MOUTH EVERYDAY AT BEDTIME   multivitamin-prenatal 27-0.8 MG Tabs tablet Take 1 tablet by mouth daily at 12 noon.   PROBIOTIC PO Take by mouth.         Discharge home in stable condition Infant Feeding: Bottle Infant Disposition:home with mother Discharge instruction: per After Visit Summary and Postpartum booklet. Activity: Advance as tolerated. Pelvic rest for 6 weeks.  Diet: routine diet Anticipated Birth Control: Condoms and NFP Postpartum Appointment:6 weeks Additional Postpartum F/U: incision/BP check in one week, video visit in 2 weeks Future Appointments:No future appointments. Follow up Visit:  Follow-up Information     [facility]. Schedule an appointment as soon as possible for a visit.   Specialty: Obstetrics and Gynecology Why: Incision/BP check in one week Video visit in 2 weeks Postpartum office visit in 6 weeks Contact information: [place] [phone] [place] 04/25/2022 11:13 AM

## 2022-04-25 NOTE — Discharge Instructions (Signed)

## 2022-04-25 NOTE — Final Progress Note (Addendum)
Subjective: Postpartum Day 2: Cesarean Delivery Joyce Smith is feeling well overall. She is ambulating, voiding, and tolerating POs without difficulty. She had a bowel movement. Her pain is well-controlled and her bleeding is WNL. Her mood is stable. Bottlefeeding is going well. She would like to go home today. She has been normotensive except for her BP this morning, but she had not yet received her labetalol. She denies HA, visual changes, and epigastric pain.    Objective: Vital signs in last 24 hours: Temp:  [97.7 F (36.5 C)-98.3 F (36.8 C)] 97.9 F (36.6 C) (12/16 0809) Pulse Rate:  [81-98] 85 (12/16 1053) Resp:  [17-20] 17 (12/16 1053) BP: (117-152)/(51-79) 152/74 (12/16 1053) SpO2:  [98 %-100 %] 100 % (12/16 1053)  Physical Exam:  General: alert, cooperative, and appears stated age Heart: RRR Lungs: CTAB Abdomen: soft, non-tender, normal BS Lochia: appropriate Uterine Fundus: firm Incision: healing well, no significant drainage, no significant erythema DVT Evaluation: No evidence of DVT seen on physical exam.  Recent Labs    04/24/22 0335  HGB 10.4*  HCT 32.5*    Assessment/Plan: Status post Cesarean section. Doing well postoperatively.  Discharge home with standard precautions  NFP/condoms for contraception BP/incision check in one week Video visit in 2 weeks PP office visit in 6 weeks  Glenetta Borg, CNM 04/25/2022, 11:13 AM

## 2022-04-25 NOTE — Progress Notes (Signed)
Discharge instructions given and reviewed with pt and family. Pt educated on follow up care, appointments and when to notify provider, questions invited and answered. ID bands matched with infant.  Pt and family noted understanding  to teaching/education.Pt escorted off unit by staff tech in wheelchair with infant safely in arms. Infant secured in car seat by family.

## 2022-04-26 ENCOUNTER — Emergency Department: Payer: Medicaid Other

## 2022-04-26 ENCOUNTER — Other Ambulatory Visit: Payer: Self-pay

## 2022-04-26 ENCOUNTER — Emergency Department
Admission: EM | Admit: 2022-04-26 | Discharge: 2022-04-26 | Disposition: A | Discharge status: Home / Self Care | Payer: Medicaid Other | Attending: Emergency Medicine | Admitting: Emergency Medicine

## 2022-04-26 DIAGNOSIS — I10 Essential (primary) hypertension: Secondary | ICD-10-CM | POA: Diagnosis present

## 2022-04-26 DIAGNOSIS — Z98891 History of uterine scar from previous surgery: Secondary | ICD-10-CM

## 2022-04-26 LAB — CBC WITH DIFFERENTIAL/PLATELET
Abs Immature Granulocytes: 0.05 10*3/uL (ref 0.00–0.07)
Basophils Absolute: 0 10*3/uL (ref 0.0–0.1)
Basophils Relative: 0 %
Eosinophils Absolute: 0.3 10*3/uL (ref 0.0–0.5)
Eosinophils Relative: 4 %
HCT: 35 % — ABNORMAL LOW (ref 36.0–46.0)
Hemoglobin: 11.1 g/dL — ABNORMAL LOW (ref 12.0–15.0)
Immature Granulocytes: 1 %
Lymphocytes Relative: 20 %
Lymphs Abs: 1.5 10*3/uL (ref 0.7–4.0)
MCH: 27.1 pg (ref 26.0–34.0)
MCHC: 31.7 g/dL (ref 30.0–36.0)
MCV: 85.6 fL (ref 80.0–100.0)
Monocytes Absolute: 0.6 10*3/uL (ref 0.1–1.0)
Monocytes Relative: 8 %
Neutro Abs: 5.1 10*3/uL (ref 1.7–7.7)
Neutrophils Relative %: 67 %
Platelets: 268 10*3/uL (ref 150–400)
RBC: 4.09 MIL/uL (ref 3.87–5.11)
RDW: 14.8 % (ref 11.5–15.5)
WBC: 7.5 10*3/uL (ref 4.0–10.5)
nRBC: 0 % (ref 0.0–0.2)

## 2022-04-26 LAB — URINALYSIS, ROUTINE W REFLEX MICROSCOPIC
Bacteria, UA: NONE SEEN
Bilirubin Urine: NEGATIVE
Glucose, UA: NEGATIVE mg/dL
Ketones, ur: NEGATIVE mg/dL
Leukocytes,Ua: NEGATIVE
Nitrite: NEGATIVE
Protein, ur: NEGATIVE mg/dL
Specific Gravity, Urine: 1.005 (ref 1.005–1.030)
pH: 7 (ref 5.0–8.0)

## 2022-04-26 LAB — COMPREHENSIVE METABOLIC PANEL
ALT: 13 U/L (ref 0–44)
AST: 23 U/L (ref 15–41)
Albumin: 3 g/dL — ABNORMAL LOW (ref 3.5–5.0)
Alkaline Phosphatase: 67 U/L (ref 38–126)
Anion gap: 9 (ref 5–15)
BUN: 7 mg/dL (ref 6–20)
CO2: 24 mmol/L (ref 22–32)
Calcium: 9 mg/dL (ref 8.9–10.3)
Chloride: 104 mmol/L (ref 98–111)
Creatinine, Ser: 0.66 mg/dL (ref 0.44–1.00)
GFR, Estimated: >60 mL/min (ref >60)
Glucose, Bld: 86 mg/dL (ref 70–99)
Potassium: 4.1 mmol/L (ref 3.5–5.1)
Sodium: 137 mmol/L (ref 135–145)
Total Bilirubin: 0.4 mg/dL (ref 0.3–1.2)
Total Protein: 7.3 g/dL (ref 6.5–8.1)

## 2022-04-26 LAB — BRAIN NATRIURETIC PEPTIDE: B Natriuretic Peptide: 225.6 pg/mL — ABNORMAL HIGH (ref 0.0–100.0)

## 2022-04-26 LAB — PROTEIN / CREATININE RATIO, URINE
Creatinine, Urine: 45 mg/dL
Total Protein, Urine: <6 mg/dL

## 2022-04-26 LAB — MAGNESIUM: Magnesium: 1.8 mg/dL (ref 1.7–2.4)

## 2022-04-26 MED ORDER — ACETAMINOPHEN 500 MG PO TABS
1000.0000 mg | ORAL_TABLET | Freq: Once | ORAL | Status: AC
  Administered 2022-04-26: 1000 mg via ORAL
  Filled 2022-04-26: qty 2

## 2022-04-26 MED ORDER — LABETALOL HCL 5 MG/ML IV SOLN: 10.0000 mg | Freq: Once | INTRAVENOUS | Status: DC

## 2022-04-26 MED ORDER — LABETALOL HCL 100 MG PO TABS
100.0000 mg | ORAL_TABLET | Freq: Once | ORAL | Status: DC
  Filled 2022-04-26: qty 1

## 2022-04-26 MED ORDER — LABETALOL HCL 200 MG PO TABS
200.0000 mg | ORAL_TABLET | Freq: Once | ORAL | Status: AC
  Administered 2022-04-26: 200 mg via ORAL
  Filled 2022-04-26: qty 1

## 2022-04-26 MED ORDER — LABETALOL HCL 100 MG PO TABS: 200.0000 mg | ORAL_TABLET | Freq: Two times a day (BID) | ORAL | 11 refills | Status: DC

## 2022-04-26 NOTE — ED Provider Triage Note (Signed)
Emergency Medicine Provider Triage Evaluation Note  Joyce Smith , a 21 y.o. female  was evaluated in triage.  Pt complains of headache and hypertension. 3 days postpartum--c section.  Patient was discharged home yesterday.  She has had a headache today and checked her blood pressure.  It was elevated even though she had already taken her labetalol.  She called Dr. Logan Bores on-call nurse and was advised to come in for evaluation.Marland Kitchen  Physical Exam  BP (!) 167/105 (BP Location: Right Arm)   Pulse 93   Temp 98 F (36.7 C) (Oral)   Resp 16   Ht 5\' 4"  (1.626 m)   Wt (!) 137 kg   LMP 07/22/2021 (Exact Date)   SpO2 100%   BMI 51.84 kg/m  Gen:   Awake, no distress   Resp:  Normal effort  MSK:   Moves extremities without difficulty  Other:    Medical Decision Making  Medically screening exam initiated at 6:55 PM.  Appropriate orders placed.  Joyce Smith was informed that the remainder of the evaluation will be completed by another provider, this initial triage assessment does not replace that evaluation, and the importance of remaining in the ED until their evaluation is complete.    Karolee Stamps, FNP 04/26/22 1857

## 2022-04-26 NOTE — ED Triage Notes (Signed)
LDR called about patient, Pt to be medically cleared in ER

## 2022-04-26 NOTE — ED Triage Notes (Signed)
Pt to ED from home for post-partum hypertension. Pt was discharged yesterday. She is three days post partum. She has a really bad headache and assessed her BP and was 170/100. Pt has had swelling in her lower extremities as well. She was a C-section delivery.

## 2022-04-26 NOTE — ED Provider Notes (Signed)
[facility] Provider Note    Event Date/Time   First MD Initiated Contact with Patient 04/26/22 1957     (approximate)   History   Postpartum Complications   HPI  Joyce Smith is a 21 y.o. female with elevated BMI, chronic hypertension who comes in with concerns for elevated blood pressure and headache.  Patient reports that she has been on labetalol since the beginning of this pregnancy.  She is never diagnosed with preeclampsia or eclampsia her blood pressures been well-controlled on the labetalol that she was on prior to pregnancy.  She reports that she had an induction at 39 weeks that failed due to on reassuring tracing she had to have a C-section.  She reports that she was discharged yesterday.  She reports that she was at home when she had some mild headaches she had a brief few seconds of some spots in her bilateral eyes when she was standing up and walking up some stairs but this resolved instantly and reports normal vision now.  Reports headache is very minimal.  Physical Exam   Triage Vital Signs: ED Triage Vitals  Enc Vitals Group     BP 04/26/22 1852 (!) 167/105     Pulse Rate 04/26/22 1852 93     Resp 04/26/22 1852 16     Temp 04/26/22 1852 98 F (36.7 C)     Temp Source 04/26/22 1852 Oral     SpO2 04/26/22 1852 100 %     Weight 04/26/22 1847 (!) 302 lb 0.5 oz (137 kg)     Height 04/26/22 1847 5\' 4"  (1.626 m)     Head Circumference --      Peak Flow --      Pain Score 04/26/22 1847 0     Pain Loc --      Pain Edu? --      Excl. in [place]? --     Most recent vital signs: Vitals:   04/26/22 1852 04/26/22 2000  BP: (!) 167/105 (!) 157/78  Pulse: 93 (!) 102  Resp: 16 18  Temp: 98 F (36.7 C)   SpO2: 100% 100%     General: Awake, no distress.  CV:  Good peripheral perfusion.  Resp:  Normal effort.  Abd:  Patient has dressing noted over her surgical scar.  No rebound or guarding.  No redness or warmth noted near her  scar Other:  Extraocular movements are intact.  Cranial nerves intact.  Pupils are reactive bilaterally.  Reports normal vision.  Equal strength in arms and legs.  Sensation intact. Slight swelling noted in her bilateral legs.  No calf tenderness.  ED Results / Procedures / Treatments   Labs (all labs ordered are listed, but only abnormal results are displayed) Labs Reviewed  COMPREHENSIVE METABOLIC PANEL - Abnormal; Notable for the following components:      Result Value   Albumin 3.0 (*)    All other components within normal limits  CBC WITH DIFFERENTIAL/PLATELET - Abnormal; Notable for the following components:   Hemoglobin 11.1 (*)    HCT 35.0 (*)    All other components within normal limits  URINALYSIS, ROUTINE W REFLEX MICROSCOPIC - Abnormal; Notable for the following components:   Color, Urine STRAW (*)    APPearance CLEAR (*)    Hgb urine dipstick MODERATE (*)    All other components within normal limits  BRAIN NATRIURETIC PEPTIDE - Abnormal; Notable for the following components:   B Natriuretic Peptide 225.6 (*)  All other components within normal limits  MAGNESIUM  PROTEIN / CREATININE RATIO, URINE     EKG  My interpretation of EKG:  Sinus tachycardia rate of 102 without any ST elevation, T wave version in lead III, normal intervals.  Reviewed prior EKG from July and she had similar T wave inversion  RADIOLOGY I have reviewed the xray personally and interpreted no evidence of any pneumonia  PROCEDURES:  Critical Care performed: No  .1-3 Lead EKG Interpretation  Performed by: Vanessa Woodstock, MD Authorized by: Vanessa Welton, MD     Interpretation: normal     ECG rate:  95   ECG rate assessment: normal     Rhythm: sinus rhythm     Ectopy: none     Conduction: normal      MEDICATIONS ORDERED IN ED: Medications  labetalol (NORMODYNE) tablet 200 mg (has no administration in time range)  acetaminophen (TYLENOL) tablet 1,000 mg (1,000 mg Oral Given  04/26/22 2018)     IMPRESSION / MDM / ASSESSMENT AND PLAN / ED COURSE  I reviewed the triage vital signs and the nursing notes.   Patient's presentation is most consistent with acute presentation with potential threat to life or bodily function.  Patient comes in with elevated blood pressure in the setting of recent delivery.  Labs ordered evaluate for help syndrome, preeclampsia, eclampsia.  She reports her headache right now is very minimal neuroexam is normal.  Will trial some Tylenol.  She is some mild swelling her legs but she reports it is improving from yesterday.  No calf tenderness when legs not bigger than the other doubt DVT no shortness of breath to suggest PE.   Protein to creatinine ratio was normal she had no protein in her urine.  CMP reassuring magnesium normal CBC shows hemoglobin that is uptrending from 2 days ago white count is normal.  Urine without any evidence of protein in it.  Her repeat blood pressures have come down to 153/83 and she is due for her labetalol at this time.  She last took labetalol this morning.  She is on 100 mg twice daily.  Her BNP is slightly elevated but no edema noted on chest x-ray and no shortness of breath.  I did discuss the case with Dr. Marcelline Mates and she is not really concerned about the BNP.  She can follow-up outpatient.  Recommended going up to labetalol 300 twice daily but patient does report having some low blood pressures previously so we will start off with 200 twice daily.  Will give her her first dose here and she can follow-up outpatient for blood pressure recheck.  At this time when I reevaluated her she reports complete resolution of headache.  We discussed CT imaging but given no headache at this time and normal neuroexam I have very low suspicion for venous thrombus or intracranial hemorrhage or other acute pathology.  She agrees and would like to hold off on any CT.  We discussed return precautions and she expressed understanding and  felt comfortable and would prefer discharge home.  Considered admission but given no signs of a preeclampsia patient can be discharged  The patient is on the cardiac monitor to evaluate for evidence of arrhythmia and/or significant heart rate changes.      FINAL CLINICAL IMPRESSION(S) / ED DIAGNOSES   Final diagnoses:  H/O cesarean section  Hypertension, unspecified type     Rx / DC Orders   ED Discharge Orders  Ordered    labetalol (NORMODYNE) 100 MG tablet  2 times daily        04/26/22 2127             Note:  This document was prepared using Dragon voice recognition software and may include unintentional dictation errors.   Concha Se, MD 04/26/22 2128

## 2022-04-26 NOTE — Discharge Instructions (Addendum)
There is no signs of preeclampsia today based upon your blood work although your blood pressures are elevated.  I discussed with Dr. Valentino Saxon from OB/GYN who wanted you to go up to 200 mg of your labetalol twice daily.  This is taking 2 pills twice daily.  If you notice your blood pressures are still staying really elevated you could even go up to 300 mg.  We have held off on the imaging of your head given you had resolution of your headache with Tylenol.  However if you notice worsening headaches, vision changes worsening leg swelling shortness of breath or any other concerns and please return to the ER for repeat evaluation.

## 2022-04-26 NOTE — ED Notes (Signed)
This RN called upstairs and spoke to [employer], asked about them accepting this patient and they advised no, that she must be medically cleared downstairs first.

## 2022-04-27 ENCOUNTER — Telehealth: Payer: Self-pay

## 2022-04-27 NOTE — Telephone Encounter (Signed)
Called Joyce Smith to follow up to see how she was feeling, left voicemail

## 2022-04-27 NOTE — Telephone Encounter (Signed)
Patient is returning missed call. Please advise 

## 2022-04-28 ENCOUNTER — Telehealth: Payer: Self-pay

## 2022-04-28 NOTE — Telephone Encounter (Signed)
Pt calling; del by c/x 12/14th d/c'd 12/16th; on the 17th her BP was 170/100; today BP 160/90; is on labatalol 200mg  BID.  What to do?

## 2022-04-29 ENCOUNTER — Ambulatory Visit (INDEPENDENT_AMBULATORY_CARE_PROVIDER_SITE_OTHER): Payer: Medicaid Other | Admitting: Obstetrics and Gynecology

## 2022-04-29 ENCOUNTER — Encounter: Payer: Self-pay | Admitting: Obstetrics and Gynecology

## 2022-04-29 DIAGNOSIS — O135 Gestational [pregnancy-induced] hypertension without significant proteinuria, complicating the puerperium: Secondary | ICD-10-CM

## 2022-04-29 MED ORDER — NIFEDIPINE ER OSMOTIC RELEASE 30 MG PO TB24: 30.0000 mg | ORAL_TABLET | Freq: Two times a day (BID) | ORAL | 2 refills | Status: DC
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
## 2022-04-29 NOTE — Progress Notes (Signed)
Patient presents today for 1 week postpartum follow-up. Patient had a cesarean delivery on 04/23/22. She has been having elevated BP readings at home, yesterday she reports taking 300 mg of Labetalol. Readings after medication still elevated (167/110, 163/95, 160/93). Patient is bottle feeding. She states she is unsure of what she would like for birth control. EPDS score of 1 .Patient states no other questions or concerns at this time.

## 2022-05-06 ENCOUNTER — Encounter: Payer: Medicaid Other | Admitting: Obstetrics and Gynecology

## 2022-05-14 ENCOUNTER — Ambulatory Visit (INDEPENDENT_AMBULATORY_CARE_PROVIDER_SITE_OTHER): Payer: Medicaid Other | Admitting: Obstetrics and Gynecology

## 2022-05-14 ENCOUNTER — Encounter: Payer: Self-pay | Admitting: Obstetrics and Gynecology

## 2022-05-14 NOTE — Progress Notes (Signed)
Patient presents today for 3 week postpartum follow-up. Patient had a cesarean delivery on 04/23/22. She is bottle feeding. Reports she is continuing to take her BP medication bid.  EPDS score of 1 . She states no other questions or concerns at this time.
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
## 2022-05-27 ENCOUNTER — Encounter: Payer: Self-pay | Admitting: Obstetrics and Gynecology

## 2022-06-08 ENCOUNTER — Telehealth: Payer: Self-pay

## 2022-06-08 NOTE — Telephone Encounter (Signed)
[facility]- Discharge Call Backs-Pt did not answer.  Left a VM with the following information below. 1-Do you have any questions or concerns about yourself as you heal?  C-Sec 2-Any concerns or questions about your baby? 3-Where does your baby sleep in your home? Review ABC's of safe sleep. 4-How was your stay at the hospital? 5-How did our team work together to care for you? You should be receiving a survey in the mail soon.   We would really appreciate it if you could fill that out for Joyce Smith and return it in the mail.  We value the feedback to make improvements and continue the great work we do.   If you have any questions please feel free to call me back at [phone]

## 2022-06-09 ENCOUNTER — Encounter: Payer: Self-pay | Admitting: Obstetrics and Gynecology

## 2022-06-09 ENCOUNTER — Ambulatory Visit (INDEPENDENT_AMBULATORY_CARE_PROVIDER_SITE_OTHER): Payer: Medicaid Other | Admitting: Obstetrics and Gynecology

## 2022-06-09 ENCOUNTER — Other Ambulatory Visit (HOSPITAL_COMMUNITY)
Admission: RE | Admit: 2022-06-09 | Discharge: 2022-06-09 | Disposition: A | Discharge status: Home / Self Care | Payer: Medicaid Other | Source: Ambulatory Visit | Attending: Obstetrics and Gynecology | Admitting: Obstetrics and Gynecology

## 2022-06-09 DIAGNOSIS — N898 Other specified noninflammatory disorders of vagina: Secondary | ICD-10-CM | POA: Diagnosis not present

## 2022-06-09 NOTE — Progress Notes (Signed)
Patient presents today for 6 week postpartum follow-up. Patient had a cesarean delivery on 04/24/23. Patient is bottle feeding baby. She states she is unsure of what she would like  for birth control. EPDS score of 7 . She does report a vaginal odor, culture ordered.  Patient states no other questions or concerns at this time.
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
## 2022-06-11 LAB — CERVICOVAGINAL ANCILLARY ONLY
Bacterial Vaginitis (gardnerella): NEGATIVE
Chlamydia: NEGATIVE
Comment: NEGATIVE
Comment: NEGATIVE
Comment: NORMAL
Neisseria Gonorrhea: NEGATIVE

## 2022-06-15 DIAGNOSIS — Z0000 Encounter for general adult medical examination without abnormal findings: Secondary | ICD-10-CM

## 2022-06-15 HISTORY — DX: Encounter for general adult medical examination without abnormal findings: Z00.00

## 2022-06-16 DIAGNOSIS — R7989 Other specified abnormal findings of blood chemistry: Secondary | ICD-10-CM

## 2022-06-16 DIAGNOSIS — R5383 Other fatigue: Secondary | ICD-10-CM

## 2022-06-16 DIAGNOSIS — Z8659 Personal history of other mental and behavioral disorders: Secondary | ICD-10-CM | POA: Insufficient documentation

## 2022-06-16 DIAGNOSIS — J4540 Moderate persistent asthma, uncomplicated: Secondary | ICD-10-CM

## 2022-06-16 DIAGNOSIS — D649 Anemia, unspecified: Secondary | ICD-10-CM

## 2022-06-16 HISTORY — DX: Moderate persistent asthma, uncomplicated: J45.40

## 2022-06-16 HISTORY — DX: Other fatigue: R53.83

## 2022-06-16 HISTORY — DX: Other specified abnormal findings of blood chemistry: R79.89

## 2022-06-16 HISTORY — DX: Personal history of other mental and behavioral disorders: Z86.59

## 2022-06-16 HISTORY — DX: Anemia, unspecified: D64.9

## 2022-07-03 ENCOUNTER — Telehealth: Payer: Self-pay

## 2022-07-03 ENCOUNTER — Other Ambulatory Visit: Payer: Self-pay

## 2022-07-03 ENCOUNTER — Emergency Department
Admission: EM | Admit: 2022-07-03 | Discharge: 2022-07-03 | Disposition: A | Discharge status: Home / Self Care | Payer: Medicaid Other | Attending: Emergency Medicine | Admitting: Emergency Medicine

## 2022-07-03 DIAGNOSIS — Z4801 Encounter for change or removal of surgical wound dressing: Secondary | ICD-10-CM | POA: Insufficient documentation

## 2022-07-03 DIAGNOSIS — I10 Essential (primary) hypertension: Secondary | ICD-10-CM | POA: Diagnosis not present

## 2022-07-03 DIAGNOSIS — J45909 Unspecified asthma, uncomplicated: Secondary | ICD-10-CM | POA: Insufficient documentation

## 2022-07-03 DIAGNOSIS — Z5189 Encounter for other specified aftercare: Secondary | ICD-10-CM

## 2022-07-03 MED ORDER — SULFAMETHOXAZOLE-TRIMETHOPRIM 800-160 MG PO TABS: 1.0000 | ORAL_TABLET | Freq: Two times a day (BID) | ORAL | 0 refills | Status: AC

## 2022-07-03 NOTE — ED Provider Notes (Signed)
[facility] Provider Note    Event Date/Time   First MD Initiated Contact with Patient 07/03/22 2140     (approximate)   History   Chief Complaint Wound Check   HPI Joyce Smith is a 22 y.o. female, history of asthma, morbid obesity, depression, hypertension, presents to the emergency department for evaluation of wound check.  She states that she had a C-section on December 14.  Yesterday, she noticed that her C-section may have "open".  She is unable to directly visualize it, was not but was able to take a picture and notes that she had some drainage.  She has an appointment scheduled with her OB/GYN in the next week, but felt this needed to be seen today.  Denies fever/chills, chest pain, shortness of breath, abdominal pain, flank pain, nausea/vomiting, diarrhea, urinary symptoms, weakness, paresthesias, or dizziness/lightheadedness.  History Limitations: No limitations.        Physical Exam  Triage Vital Signs: ED Triage Vitals  Enc Vitals Group     BP 07/03/22 2130 (!) 149/89     Pulse Rate 07/03/22 2130 72     Resp 07/03/22 2130 18     Temp 07/03/22 2130 (!) 97.5 F (36.4 C)     Temp Source 07/03/22 2130 Oral     SpO2 07/03/22 2130 99 %     Weight 07/03/22 2131 280 lb (127 kg)     Height 07/03/22 2131 '5\' 4"'$  (1.626 m)     Head Circumference --      Peak Flow --      Pain Score 07/03/22 2131 0     Pain Loc --      Pain Edu? --      Excl. in [place]? --     Most recent vital signs: Vitals:   07/03/22 2130  BP: (!) 149/89  Pulse: 72  Resp: 18  Temp: (!) 97.5 F (36.4 C)  SpO2: 99%    General: Awake, NAD.  Skin: Warm, dry. No rashes or lesions.  Eyes: PERRL. Conjunctivae normal.  CV: Good peripheral perfusion.  Resp: Normal effort.  Abd: Soft, non-tender. No distention.  Neuro: At baseline. No gross neurological deficits.  Musculoskeletal: Normal ROM of all extremities.  Focused Exam: Incision site appears well-healed.  There  is a small, 1 mm wound opening along the middle of the incision site with what appears to be small amount of purulent drainage.  However, there is not appear to be any significant dehiscence or bleeding at this time.  No surrounding warmth or erythema.  Physical Exam    ED Results / Procedures / Treatments  Labs (all labs ordered are listed, but only abnormal results are displayed) Labs Reviewed - No data to display   EKG N/A.    RADIOLOGY  ED Provider Interpretation: N/A.  No results found.  PROCEDURES:  Critical Care performed: N/A.  Procedures    MEDICATIONS ORDERED IN ED: Medications - No data to display   IMPRESSION / MDM / [place] / ED COURSE  I reviewed the triage vital signs and the nursing notes.                              Differential diagnosis includes, but is not limited to, wound dehiscence, cellulitis, abscess, encounter for wound check.  Assessment/Plan Patient presents for wound check.  She appears well clinically.  On exam, there does appear to be a  very small, 1 mm round opening with what appears to be some degree of purulent drainage.  However, there is not appear to be any obvious dehiscence.  Vitals within normal limits.  Will provide her with a very short course of antibiotics to treat for possible developing infection, though I suspect that she will do well.  Advised her to follow-up with her OB/GYN for reevaluation as needed.  She was amenable to this.  Will discharge.  Provided the patient with anticipatory guidance, return precautions, and educational material. Encouraged the patient to return to the emergency department at any time if they begin to experience any new or worsening symptoms. Patient expressed understanding and agreed with the plan.   Patient's presentation is most consistent with acute complicated illness / injury requiring diagnostic workup.       FINAL CLINICAL IMPRESSION(S) / ED DIAGNOSES   Final  diagnoses:  Visit for wound check     Rx / DC Orders   ED Discharge Orders          Ordered    sulfamethoxazole-trimethoprim (BACTRIM DS) 800-160 MG tablet  2 times daily        07/03/22 2156             Note:  This document was prepared using Dragon voice recognition software and may include unintentional dictation errors.   Teodoro Spray, [place] 07/03/22 [id number]    Nance Pear, MD 07/03/22 [phone]

## 2022-07-03 NOTE — Telephone Encounter (Signed)
Pt is scheduled with [facility] on 07/06/2022 at 8:!5.

## 2022-07-03 NOTE — ED Triage Notes (Signed)
Pt to ED via POV c/o incsion check. Pt states she had a csection on dec 14th. Noticed yesterday an "opening" in csection incision. Notes some bleeding but no bleeding at this time. Pt in NAD at this time.

## 2022-07-03 NOTE — Telephone Encounter (Signed)
Called pt to f/u on after hours nurse line fax. She called and said her c-section incision was bleeding. Call was taken straight to voice mail, [place].

## 2022-07-03 NOTE — Telephone Encounter (Signed)
The patient is returning missed call. Please advise?

## 2022-07-03 NOTE — ED Notes (Signed)
Pt verbalizes understanding of discharge instructions. Opportunity for questioning and answers were provided. Pt discharged from ED to home with significant other.   ? ?

## 2022-07-03 NOTE — Discharge Instructions (Addendum)
-  Overall your incision site looks well.  It did appear to be some purulent drainage, suggestive of a possible infection.  Please take the full course of the antibiotics as prescribed.  -Please follow-up with your OB/GYN as discussed.  -Return to the emergency department anytime if you begin to experience any new or worsening symptoms.

## 2022-07-06 ENCOUNTER — Encounter: Payer: Self-pay | Admitting: Obstetrics

## 2022-07-06 ENCOUNTER — Ambulatory Visit (INDEPENDENT_AMBULATORY_CARE_PROVIDER_SITE_OTHER): Payer: Medicaid Other | Admitting: Obstetrics

## 2022-07-06 VITALS — BP 129/74 | HR 72 | Resp 15 | Wt 275.2 lb

## 2022-07-06 DIAGNOSIS — O135 Gestational [pregnancy-induced] hypertension without significant proteinuria, complicating the puerperium: Secondary | ICD-10-CM | POA: Diagnosis not present

## 2022-07-06 DIAGNOSIS — F4323 Adjustment disorder with mixed anxiety and depressed mood: Secondary | ICD-10-CM

## 2022-07-06 DIAGNOSIS — O165 Unspecified maternal hypertension, complicating the puerperium: Secondary | ICD-10-CM

## 2022-07-06 MED ORDER — ESCITALOPRAM OXALATE 10 MG PO TABS: 10.0000 mg | ORAL_TABLET | Freq: Every day | ORAL | 2 refills | Status: DC

## 2022-07-06 MED ORDER — NIFEDIPINE ER OSMOTIC RELEASE 30 MG PO TB24: 30.0000 mg | ORAL_TABLET | Freq: Every day | ORAL | 2 refills | Status: DC

## 2022-07-06 NOTE — Progress Notes (Signed)
Obstetrics & Gynecology Office Visit   Chief Complaint:  Chief Complaint  Patient presents with   Follow-up    History of Present Illness: Joyce Smith is here for an incision check. She had a Cesarean Section months ago, and has presented to the ED with concerns that it may be opening. She made a follow up appt to further discuss hr worries. She was placed on Procardia during the pregnancy, and is still on daily Procardia 30 XL BID. Denies any headaches.   Review of Systems:  Review of Systems  Constitutional: Negative.   HENT: Negative.    Eyes: Negative.   Respiratory: Negative.    Cardiovascular: Negative.   Genitourinary: Negative.   Musculoskeletal: Negative.   Skin:        CS incision is well healed with no areas of separation.   Psychiatric/Behavioral:  The patient is nervous/anxious.        GAD score 14 Edinburgh score is 9 PHQ 13.     Past Medical History:  Past Medical History:  Diagnosis Date   Anxiety    Asthma    Depression    Elevated blood pressure reading    Heart murmur    Hypertension    Seasonal allergies     Past Surgical History:  Past Surgical History:  Procedure Laterality Date   CESAREAN SECTION  04/23/2022   Procedure: CESAREAN SECTION;  Surgeon: Harlin Heys, MD;  Location: [facility] ORS;  Service: Obstetrics;;   NONE      Gynecologic History: Patient's last menstrual period was 07/03/2022.  Obstetric History: G2P1011  Family History:  Family History  Problem Relation Age of Onset   Asthma Mother    Hypertension Mother    Thyroid disease Mother    Stroke Maternal Grandmother    Cancer Paternal Grandmother        BREAST and Skin Cancer   Diabetes Paternal Grandmother    Hypertension Paternal Grandmother    Rashes / Skin problems Paternal Grandmother    Stroke Paternal Grandmother    Heart disease Neg Hx     Social History:  Social History   Socioeconomic History   Marital status: Single    Spouse name: Not on file    Number of children: Not on file   Years of education: Not on file   Highest education level: Not on file  Occupational History   Not on file  Tobacco Use   Smoking status: Never   Smokeless tobacco: Never  Vaping Use   Vaping Use: Never used  Substance and Sexual Activity   Alcohol use: No   Drug use: Never   Sexual activity: Yes    Birth control/protection: None  Other Topics Concern   Not on file  Social History Narrative   Not on file   Social Determinants of Health   Financial Resource Strain: Not on file  Food Insecurity: No Food Insecurity (04/22/2022)   Hunger Vital Sign    Worried About Running Out of Food in the Last Year: Never true    Ran Out of Food in the Last Year: Never true  Transportation Needs: No Transportation Needs (04/22/2022)   PRAPARE - Hydrologist (Medical): No    Lack of Transportation (Non-Medical): No  Physical Activity: Not on file  Stress: Not on file  Social Connections: Not on file  Intimate Partner Violence: Not At Risk (04/22/2022)   Humiliation, Afraid, Rape, and Kick questionnaire    Fear  of Current or Ex-Partner: No    Emotionally Abused: No    Physically Abused: No    Sexually Abused: No    Allergies:  No Known Allergies  Medications: Prior to Admission medications   Medication Sig Start Date End Date Taking? Authorizing Provider  albuterol (VENTOLIN HFA) 108 (90 Base) MCG/ACT inhaler Inhale 2 puffs into the lungs every 6 (six) hours as needed for wheezing or shortness of breath. 07/24/20  Yes Philip Aspen, CNM  budesonide-formoterol (SYMBICORT) 80-4.5 MCG/ACT inhaler Inhale 2 puffs into the lungs daily. 02/03/22  Yes Rubie Maid, MD  ferrous sulfate 324 (65 Fe) MG TBEC Take by mouth.   Yes [provider]  fexofenadine (ALLEGRA) 180 MG tablet Take 180 mg by mouth daily.   Yes [provider]  montelukast (SINGULAIR) 10 MG tablet TAKE 1 TABLET BY MOUTH EVERYDAY AT BEDTIME 10/14/20   Yes Philip Aspen, CNM  NIFEdipine (PROCARDIA-XL/NIFEDICAL-XL) 30 MG 24 hr tablet Take 1 tablet (30 mg total) by mouth 2 (two) times daily. Can increase to twice a day as needed for symptomatic contractions 04/29/22  Yes Harlin Heys, MD  Probiotic Product (PROBIOTIC PO) Take by mouth.   Yes [provider]  sulfamethoxazole-trimethoprim (BACTRIM DS) 800-160 MG tablet Take 1 tablet by mouth 2 (two) times daily for 5 days. 07/03/22 07/08/22 Yes Teodoro Spray, PA    Physical Exam Vitals:  Vitals:   07/06/22 0824  BP: 129/74  Pulse: 72  Resp: 15   Patient's last menstrual period was 07/03/2022.  Physical Exam Constitutional:      Appearance: Normal appearance. She is obese.  Cardiovascular:     Rate and Rhythm: Normal rate and regular rhythm.     Pulses: Normal pulses.     Heart sounds: Normal heart sounds.  Pulmonary:     Effort: Pulmonary effort is normal.     Breath sounds: Normal breath sounds.  Abdominal:     Palpations: Abdomen is soft.     Comments: Cesarean section scar is well healed- no areas of separation or oozing. Completely closed.  Genitourinary:    Comments: deferred Skin:    General: Skin is warm and dry.  Neurological:     Mental Status: She is alert.  Psychiatric:     Comments: Admits to fixating on the incision as one manifestation of her long hx of anxiety and mood issues. Not under any mental health caregiver.Strong family hx of anxiety.      Assessment: 22 y.o. [id number] with ongoing concerns re her CS incision. Suspect ongoing anxiety with some mixed mood disturbance.  Plan: Problem List Items Addressed This Visit   None Visit Diagnoses     Alteration in skin integrity related to surgical incision    -  Primary   Adjustment reaction with anxiety and depression       Hypertension complicating pregnancy, childbirth and puerperium with baby delivered and postnatal complication         We discussed her clean incision line,  and she agrees that she is over focused on this, which is likely due to untreated anxiety and possible other mood issues. After having her complete the PHQ and the GAD, we talked about starting on an SSRI that might address her mood. She agrees to a trial of Lexapro 10 mg po daily. I reviewed the commons side effects of SSRIs.  I have strongly advised her to find a PCP who can  work with her  longer term on the mood  concerns. She will RTC in 2 months for a medication review. I also dropped her Procardia medication to once QD, and will review this at hr next visit. I have encouraged her to f/u with the MDs re her HTN.  A visit is planned for her to have a BP check in 2 weeks.  Imagene Riches, CNM  07/06/2022 6:39 PM

## 2022-07-23 ENCOUNTER — Ambulatory Visit (INDEPENDENT_AMBULATORY_CARE_PROVIDER_SITE_OTHER): Payer: Medicaid Other | Admitting: Certified Nurse Midwife

## 2022-07-23 ENCOUNTER — Encounter: Payer: Self-pay | Admitting: Certified Nurse Midwife

## 2022-07-23 VITALS — BP 129/83 | HR 77 | Wt 276.2 lb

## 2022-07-23 DIAGNOSIS — R1900 Intra-abdominal and pelvic swelling, mass and lump, unspecified site: Secondary | ICD-10-CM

## 2022-07-23 NOTE — Progress Notes (Signed)
GYN ENCOUNTER NOTE  Subjective:       Joyce Smith is a 22 y.o. G42P1011 female is here for gynecologic evaluation of the following issues:  1. Feels like air is going into her cervix /uterus with intercourse. States she had done some reading and was concerned she had prolapse.    Gynecologic History Patient's last menstrual period was 07/03/2022 (exact date). Contraception: rhythm method Last Pap: n/a. Last mammogram: n/a   Obstetric History OB History  Gravida Para Term Preterm AB Living  '2 1 1 '$ 0 1 1  SAB IAB Ectopic Multiple Live Births  1 0 0 0 1    # Outcome Date GA Lbr Len/2nd Weight Sex Delivery Anes PTL Lv  2 Term 04/23/22 [redacted]w[redacted]d 6 lb 11.6 oz (3.05 kg) F CS-LTranv Spinal  LIV  1 SAB 06/20/21 917w2d U        Past Medical History:  Diagnosis Date   Anxiety    Asthma    Depression    Elevated blood pressure reading    Heart murmur    Hypertension    Seasonal allergies     Past Surgical History:  Procedure Laterality Date   CESAREAN SECTION  04/23/2022   Procedure: CESAREAN SECTION;  Surgeon: EvHarlin HeysMD;  Location: [facility] ORS;  Service: Obstetrics;;   NONE      Current Outpatient Medications on File Prior to Visit  Medication Sig Dispense Refill   albuterol (VENTOLIN HFA) 108 (90 Base) MCG/ACT inhaler Inhale 2 puffs into the lungs every 6 (six) hours as needed for wheezing or shortness of breath. 8 g 0   budesonide-formoterol (SYMBICORT) 80-4.5 MCG/ACT inhaler Inhale 2 puffs into the lungs daily. 1 each 12   escitalopram (LEXAPRO) 10 MG tablet Take 1 tablet (10 mg total) by mouth at bedtime. 30 tablet 2   ferrous sulfate 324 (65 Fe) MG TBEC Take by mouth.     fexofenadine (ALLEGRA) 180 MG tablet Take 180 mg by mouth daily.     montelukast (SINGULAIR) 10 MG tablet TAKE 1 TABLET BY MOUTH EVERYDAY AT BEDTIME 30 tablet 11   NIFEdipine (PROCARDIA-XL/NIFEDICAL-XL) 30 MG 24 hr tablet Take 1 tablet (30 mg total) by mouth daily. 30 tablet 2   Probiotic  Product (PROBIOTIC PO) Take by mouth.     No current facility-administered medications on file prior to visit.    No Known Allergies  Social History   Socioeconomic History   Marital status: Single    Spouse name: Not on file   Number of children: Not on file   Years of education: Not on file   Highest education level: Not on file  Occupational History   Not on file  Tobacco Use   Smoking status: Never   Smokeless tobacco: Never  Vaping Use   Vaping Use: Never used  Substance and Sexual Activity   Alcohol use: No   Drug use: Never   Sexual activity: Yes    Birth control/protection: None  Other Topics Concern   Not on file  Social History Narrative   Not on file   Social Determinants of Health   Financial Resource Strain: Not on file  Food Insecurity: No Food Insecurity (04/22/2022)   Hunger Vital Sign    Worried About Running Out of Food in the Last Year: Never true    Ran Out of Food in the Last Year: Never true  Transportation Needs: No Transportation Needs (04/22/2022)   PRAPARE - Transportation  Lack of Transportation (Medical): No    Lack of Transportation (Non-Medical): No  Physical Activity: Not on file  Stress: Not on file  Social Connections: Not on file  Intimate Partner Violence: Not At Risk (04/22/2022)   Humiliation, Afraid, Rape, and Kick questionnaire    Fear of Current or Ex-Partner: No    Emotionally Abused: No    Physically Abused: No    Sexually Abused: No    Family History  Problem Relation Age of Onset   Asthma Mother    Hypertension Mother    Thyroid disease Mother    Stroke Maternal Grandmother    Cancer Paternal Grandmother        BREAST and Skin Cancer   Diabetes Paternal Grandmother    Hypertension Paternal Grandmother    Rashes / Skin problems Paternal Grandmother    Stroke Paternal Grandmother    Heart disease Neg Hx     The following portions of the patient's history were reviewed and updated as appropriate:  allergies, current medications, past family history, past medical history, past social history, past surgical history and problem list.  Review of Systems Review of Systems - Negative except as mentioned in HPI Review of Systems - General ROS: negative for - chills, fatigue, fever, hot flashes, malaise or night sweats Hematological and Lymphatic ROS: negative for - bleeding problems or swollen lymph nodes Gastrointestinal ROS: negative for - abdominal pain, blood in stools, change in bowel habits and nausea/vomiting Musculoskeletal ROS: negative for - joint pain, muscle pain or muscular weakness Genito-Urinary ROS: negative for - change in menstrual cycle, dysmenorrhea, dyspareunia, dysuria, genital discharge, genital ulcers, hematuria, incontinence, irregular/heavy menses, nocturia or pelvic pain  Objective:   BP 129/83   Pulse 77   Wt 276 lb 3.2 oz (125.3 kg)   LMP 07/03/2022 (Exact Date)   BMI 47.41 kg/m  CONSTITUTIONAL: Well-developed, well-nourished female in no acute distress.  HENT:  Normocephalic, atraumatic.  NECK: Normal range of motion, supple, no masses.  Normal thyroid.  SKIN: Skin is warm and dry. No rash noted. Not diaphoretic. No erythema. No pallor. [place]: Alert and oriented to person, place, and time. PSYCHIATRIC: Normal mood and affect. Normal behavior. Normal judgment and thought content. CARDIOVASCULAR:Not Examined RESPIRATORY: Not Examined BREASTS: Not Examined ABDOMEN: Soft, non distended; Non tender.  No Organomegaly. PELVIC:  External Genitalia: Normal  BUS: Normal  Vagina: Normal  Cervix: Normal  Uterus: Normal size, shape,consistency, mobile  Adnexa: Normal  RV: Normal   Bladder: Nontender MUSCULOSKELETAL: Normal range of motion. No tenderness.  No cyanosis, clubbing, or edema.     Assessment:   Pelvic fullness   Plan:   No prolapse noted on exam. Reassured pt that she does not have a prolapse.  Discussed Vaginal gas (vaginal flatulence  or queefing).. Reviewed that the noise that is heard is trapped air coming out of your vagina. It's usually harmless and caused by sex, exercise or weak pelvic floor muscles. Reassurance given. Follow up prn.   Philip Aspen, CNM

## 2022-07-28 ENCOUNTER — Ambulatory Visit: Payer: Medicaid Other | Admitting: Obstetrics and Gynecology

## 2022-07-28 DIAGNOSIS — O165 Unspecified maternal hypertension, complicating the puerperium: Secondary | ICD-10-CM

## 2022-08-26 ENCOUNTER — Other Ambulatory Visit: Payer: Self-pay

## 2022-08-26 ENCOUNTER — Emergency Department
Admission: EM | Admit: 2022-08-26 | Discharge: 2022-08-26 | Disposition: A | Discharge status: Home / Self Care | Payer: Medicaid Other | Attending: Emergency Medicine | Admitting: Emergency Medicine

## 2022-08-26 DIAGNOSIS — I10 Essential (primary) hypertension: Secondary | ICD-10-CM | POA: Diagnosis not present

## 2022-08-26 DIAGNOSIS — R2243 Localized swelling, mass and lump, lower limb, bilateral: Secondary | ICD-10-CM | POA: Insufficient documentation

## 2022-08-26 DIAGNOSIS — R21 Rash and other nonspecific skin eruption: Secondary | ICD-10-CM | POA: Diagnosis present

## 2022-08-26 DIAGNOSIS — J45909 Unspecified asthma, uncomplicated: Secondary | ICD-10-CM | POA: Diagnosis not present

## 2022-08-26 LAB — CBC WITH DIFFERENTIAL/PLATELET
Abs Immature Granulocytes: 0.05 10*3/uL (ref 0.00–0.07)
Basophils Absolute: 0.1 10*3/uL (ref 0.0–0.1)
Basophils Relative: 1 %
Eosinophils Absolute: 0.2 10*3/uL (ref 0.0–0.5)
Eosinophils Relative: 2 %
HCT: 43.4 % (ref 36.0–46.0)
Hemoglobin: 13.9 g/dL (ref 12.0–15.0)
Immature Granulocytes: 0 %
Lymphocytes Relative: 19 %
Lymphs Abs: 2.4 10*3/uL (ref 0.7–4.0)
MCH: 25.5 pg — ABNORMAL LOW (ref 26.0–34.0)
MCHC: 32 g/dL (ref 30.0–36.0)
MCV: 79.5 fL — ABNORMAL LOW (ref 80.0–100.0)
Monocytes Absolute: 0.9 10*3/uL (ref 0.1–1.0)
Monocytes Relative: 7 %
Neutro Abs: 9.5 10*3/uL — ABNORMAL HIGH (ref 1.7–7.7)
Neutrophils Relative %: 71 %
Platelets: 388 10*3/uL (ref 150–400)
RBC: 5.46 MIL/uL — ABNORMAL HIGH (ref 3.87–5.11)
RDW: 14.3 % (ref 11.5–15.5)
WBC: 13.2 10*3/uL — ABNORMAL HIGH (ref 4.0–10.5)
nRBC: 0 % (ref 0.0–0.2)

## 2022-08-26 LAB — COMPREHENSIVE METABOLIC PANEL
ALT: 14 U/L (ref 0–44)
AST: 21 U/L (ref 15–41)
Albumin: 4.2 g/dL (ref 3.5–5.0)
Alkaline Phosphatase: 79 U/L (ref 38–126)
Anion gap: 9 (ref 5–15)
BUN: 13 mg/dL (ref 6–20)
CO2: 23 mmol/L (ref 22–32)
Calcium: 9.5 mg/dL (ref 8.9–10.3)
Chloride: 103 mmol/L (ref 98–111)
Creatinine, Ser: 0.69 mg/dL (ref 0.44–1.00)
GFR, Estimated: >60 mL/min (ref >60)
Glucose, Bld: 81 mg/dL (ref 70–99)
Potassium: 3.6 mmol/L (ref 3.5–5.1)
Sodium: 135 mmol/L (ref 135–145)
Total Bilirubin: 0.5 mg/dL (ref 0.3–1.2)
Total Protein: 8.3 g/dL — ABNORMAL HIGH (ref 6.5–8.1)

## 2022-08-26 LAB — POC URINE PREG, ED: Preg Test, Ur: NEGATIVE
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
## 2022-08-26 NOTE — Discharge Instructions (Addendum)
I think the rash on your arms could be sun related.  You can apply moisturizer or aloe for comfort.  Please make sure you are wearing sunscreen or staying out of the sun.

## 2022-08-26 NOTE — ED Notes (Signed)
Patietn admits to feeling anxious about symptoms x 2-3 days.

## 2022-08-26 NOTE — ED Triage Notes (Signed)
C/O red rash to arms x 3 days, also c/o ankle swelling x 3 days and c/o lower leg pain.  AAOx3.  Skin warm and dry. NAD  Post partem 04/23/2022

## 2022-08-28 ENCOUNTER — Emergency Department
Admission: EM | Admit: 2022-08-28 | Discharge: 2022-08-28 | Disposition: A | Discharge status: Home / Self Care | Payer: Medicaid Other | Attending: Emergency Medicine | Admitting: Emergency Medicine

## 2022-08-28 ENCOUNTER — Other Ambulatory Visit: Payer: Self-pay

## 2022-08-28 DIAGNOSIS — M792 Neuralgia and neuritis, unspecified: Secondary | ICD-10-CM

## 2022-08-28 DIAGNOSIS — Z79899 Other long term (current) drug therapy: Secondary | ICD-10-CM | POA: Insufficient documentation

## 2022-08-28 DIAGNOSIS — M79605 Pain in left leg: Secondary | ICD-10-CM | POA: Insufficient documentation

## 2022-08-28 DIAGNOSIS — M79604 Pain in right leg: Secondary | ICD-10-CM | POA: Insufficient documentation

## 2022-08-28 LAB — CBC
HCT: 41.7 % (ref 36.0–46.0)
Hemoglobin: 13.2 g/dL (ref 12.0–15.0)
MCH: 25.7 pg — ABNORMAL LOW (ref 26.0–34.0)
MCHC: 31.7 g/dL (ref 30.0–36.0)
MCV: 81.1 fL (ref 80.0–100.0)
Platelets: 348 10*3/uL (ref 150–400)
RBC: 5.14 MIL/uL — ABNORMAL HIGH (ref 3.87–5.11)
RDW: 13.9 % (ref 11.5–15.5)
WBC: 12.2 10*3/uL — ABNORMAL HIGH (ref 4.0–10.5)
nRBC: 0 % (ref 0.0–0.2)

## 2022-08-28 LAB — BASIC METABOLIC PANEL
Anion gap: 8 (ref 5–15)
BUN: 10 mg/dL (ref 6–20)
CO2: 24 mmol/L (ref 22–32)
Calcium: 9.3 mg/dL (ref 8.9–10.3)
Chloride: 105 mmol/L (ref 98–111)
Creatinine, Ser: 0.68 mg/dL (ref 0.44–1.00)
GFR, Estimated: >60 mL/min (ref >60)
Glucose, Bld: 99 mg/dL (ref 70–99)
Potassium: 3.6 mmol/L (ref 3.5–5.1)
Sodium: 137 mmol/L (ref 135–145)

## 2022-08-28 NOTE — ED Provider Notes (Signed)
[place] EMERGENCY DEPARTMENT AT [facility] Provider Note   CSN: [id number] Arrival date & time: 08/28/22  1739     History  Chief Complaint  Patient presents with   Leg Pain    Joyce Smith is a 22 y.o. female presents to the emergency department for evaluation of pins-and-needles sensation, numbness in both legs.  She was seen 2 days ago for swelling but she states the swelling has resolved.  She describes intermittent bouts of discomfort in her legs that she has a hard time describing.  The symptoms are worse at nighttime.  She denies any warmth redness swelling or edema.  No pain or limitations walking.  No weakness.  No saddle anesthesia, sensation loss or loss of bowel or bladder symptoms.  She denies any back pain groin or thigh pain.  Symptoms have been ongoing for a couple of weeks.  She has not take any medications for the pain/discomfort in her legs  Patient states she has extreme anxiety.  She has a tendency to perform Internet searches and this triggers her anxiety requiring her to come to the emergency department.  Patient states it is normal for her heart rate to be up and elevated while in the ED.  HPI     Home Medications Prior to Admission medications   Medication Sig Start Date End Date Taking? Authorizing Provider  albuterol (VENTOLIN HFA) 108 (90 Base) MCG/ACT inhaler Inhale 2 puffs into the lungs every 6 (six) hours as needed for wheezing or shortness of breath. 07/24/20   Doreene Burke, CNM  budesonide-formoterol (SYMBICORT) 80-4.5 MCG/ACT inhaler Inhale 2 puffs into the lungs daily. 02/03/22   Hildred Laser, MD  escitalopram (LEXAPRO) 10 MG tablet Take 1 tablet (10 mg total) by mouth at bedtime. 07/06/22   Mirna Mires, CNM  ferrous sulfate 324 (65 Fe) MG TBEC Take by mouth.    [provider]  fexofenadine (ALLEGRA) 180 MG tablet Take 180 mg by mouth daily.    [provider]  montelukast (SINGULAIR) 10 MG tablet TAKE 1  TABLET BY MOUTH EVERYDAY AT BEDTIME 10/14/20   Doreene Burke, CNM  NIFEdipine (PROCARDIA-XL/NIFEDICAL-XL) 30 MG 24 hr tablet Take 1 tablet (30 mg total) by mouth daily. 07/06/22   Mirna Mires, CNM  Probiotic Product (PROBIOTIC PO) Take by mouth.    [provider]      Allergies    Patient has no known allergies.    Review of Systems   Review of Systems  Physical Exam Updated Vital Signs BP (!) 162/87   Pulse (!) 125   Temp 97.8 F (36.6 C) (Oral)   Resp 18   LMP 07/30/2022   SpO2 98%  Physical Exam Constitutional:      Appearance: She is well-developed.  HENT:     Head: Normocephalic and atraumatic.  Eyes:     Conjunctiva/sclera: Conjunctivae normal.  Cardiovascular:     Rate and Rhythm: Normal rate.  Pulmonary:     Effort: Pulmonary effort is normal. No respiratory distress.  Abdominal:     General: There is no distension.     Tenderness: There is no abdominal tenderness. There is no right CVA tenderness, left CVA tenderness or guarding.  Musculoskeletal:        General: Normal range of motion.     Cervical back: Normal range of motion.     Comments: Both lower extremities show no swelling warmth erythema or edema.  No tenderness along the popliteal fossa's, Baker's  cyst.  Normal sensation throughout.  She ambulates no antalgic gait no assistive devices.  She has normal active knee flexion extension, plantarflexion dorsiflexion.  2+ dorsalis pedis pulses.  No clonus noted bilaterally.  Her hips move well with internal ex rotation with no discomfort.  Both knees are stable to valgus and varus stress testing with no discomfort.  No tenderness in the calf, negative Homans' sign bilaterally.  She has no tenderness palpation throughout the lower extremities.  Skin:    General: Skin is warm.     Findings: No rash.  Neurological:     Mental Status: She is alert and oriented to person, place, and time.  Psychiatric:        Behavior: Behavior normal.        Thought  Content: Thought content normal.     ED Results / Procedures / Treatments   Labs (all labs ordered are listed, but only abnormal results are displayed) Labs Reviewed  CBC - Abnormal; Notable for the following components:      Result Value   WBC 12.2 (*)    RBC 5.14 (*)    MCH 25.7 (*)    All other components within normal limits  BASIC METABOLIC PANEL    EKG None  Radiology No results found.  Procedures Procedures    Medications Ordered in ED Medications - No data to display  ED Course/ Medical Decision Making/ A&P                             Medical Decision Making Amount and/or Complexity of Data Reviewed Labs: ordered.   22 year old female with bilateral leg pain that has been present for couple weeks that comes and goes, worse at nighttime.  She describes sporadic sensations of pins-and-needles throughout lower parts of the leg.  Currently asymptomatic.  She has no weakness or neurological deficits.  She has no swelling or edema and no signs of DVT bilaterally.  She appears well in no distress but does report significant anxiety and has a history of her heart rate being elevated in the ED.  Patient resting referral to local office for her anxiety.  Will have her go to [facility].  She is given information.  Will also refer to neurology for further workup and investigation of her symptoms which seem to be consistent with nerve pain Final Clinical Impression(s) / ED Diagnoses Final diagnoses:  Bilateral leg pain  Nerve pain    Rx / DC Orders ED Discharge Orders     None         Ronnette Juniper 08/28/22 1932    Chesley Noon, MD 08/29/22 1116

## 2022-08-28 NOTE — Discharge Instructions (Addendum)
Please call neurologist either in [place] in [place] to schedule follow-up appointment.  Return to the ER for any swelling, edema, redness, severe pain, fevers, worsening symptoms or any urgent changes in your health  Please call [facility] or go to the website to schedule an appointment.  They do have walk-in clinic hours available to help assist with stress, anxiety.

## 2022-08-28 NOTE — ED Triage Notes (Signed)
Pt to ED via POV from home. Pt reports leg pain and bilateral arm pain and swelling that started on Sunday. Pt was seen on 4/17 for same. Pt reports she feels the swelling is better but pain is getting worse.

## 2022-09-09 DIAGNOSIS — K279 Peptic ulcer, site unspecified, unspecified as acute or chronic, without hemorrhage or perforation: Secondary | ICD-10-CM

## 2022-09-09 HISTORY — DX: Peptic ulcer, site unspecified, unspecified as acute or chronic, without hemorrhage or perforation: K27.9

## 2022-10-09 ENCOUNTER — Emergency Department
Admission: EM | Admit: 2022-10-09 | Discharge: 2022-10-09 | Disposition: A | Discharge status: Home / Self Care | Payer: Medicaid Other | Attending: Student in an Organized Health Care Education/Training Program | Admitting: Student in an Organized Health Care Education/Training Program

## 2022-10-09 ENCOUNTER — Other Ambulatory Visit: Payer: Self-pay

## 2022-10-09 DIAGNOSIS — K253 Acute gastric ulcer without hemorrhage or perforation: Secondary | ICD-10-CM | POA: Insufficient documentation

## 2022-10-09 DIAGNOSIS — R1012 Left upper quadrant pain: Secondary | ICD-10-CM | POA: Diagnosis present

## 2022-10-09 LAB — URINALYSIS, ROUTINE W REFLEX MICROSCOPIC
Bilirubin Urine: NEGATIVE
Glucose, UA: NEGATIVE mg/dL
Hgb urine dipstick: NEGATIVE
Ketones, ur: NEGATIVE mg/dL
Leukocytes,Ua: NEGATIVE
Nitrite: NEGATIVE
Protein, ur: NEGATIVE mg/dL
Specific Gravity, Urine: 1.004 — ABNORMAL LOW (ref 1.005–1.030)
pH: 6 (ref 5.0–8.0)

## 2022-10-09 LAB — COMPREHENSIVE METABOLIC PANEL
ALT: 12 U/L (ref 0–44)
AST: 25 U/L (ref 15–41)
Albumin: 4.5 g/dL (ref 3.5–5.0)
Alkaline Phosphatase: 78 U/L (ref 38–126)
Anion gap: 12 (ref 5–15)
BUN: 13 mg/dL (ref 6–20)
CO2: 21 mmol/L — ABNORMAL LOW (ref 22–32)
Calcium: 9.1 mg/dL (ref 8.9–10.3)
Chloride: 103 mmol/L (ref 98–111)
Creatinine, Ser: 0.72 mg/dL (ref 0.44–1.00)
GFR, Estimated: >60 mL/min (ref >60)
Glucose, Bld: 89 mg/dL (ref 70–99)
Potassium: 4.2 mmol/L (ref 3.5–5.1)
Sodium: 136 mmol/L (ref 135–145)
Total Bilirubin: 0.8 mg/dL (ref 0.3–1.2)
Total Protein: 8 g/dL (ref 6.5–8.1)

## 2022-10-09 LAB — CBC
HCT: 45 % (ref 36.0–46.0)
Hemoglobin: 13.7 g/dL (ref 12.0–15.0)
MCH: 25.7 pg — ABNORMAL LOW (ref 26.0–34.0)
MCHC: 30.4 g/dL (ref 30.0–36.0)
MCV: 84.3 fL (ref 80.0–100.0)
Platelets: 302 10*3/uL (ref 150–400)
RBC: 5.34 MIL/uL — ABNORMAL HIGH (ref 3.87–5.11)
RDW: 13.8 % (ref 11.5–15.5)
WBC: 11.4 10*3/uL — ABNORMAL HIGH (ref 4.0–10.5)
nRBC: 0 % (ref 0.0–0.2)

## 2022-10-09 LAB — LIPASE, BLOOD: Lipase: 30 U/L (ref 11–51)

## 2022-10-09 LAB — POC URINE PREG, ED: Preg Test, Ur: NEGATIVE

## 2022-10-09 MED ORDER — LIDOCAINE VISCOUS HCL 2 % MT SOLN
15.0000 mL | Freq: Once | OROMUCOSAL | Status: AC
  Administered 2022-10-09: 15 mL via OROMUCOSAL
  Filled 2022-10-09: qty 15

## 2022-10-09 MED ORDER — ALUM & MAG HYDROXIDE-SIMETH 200-200-20 MG/5ML PO SUSP
30.0000 mL | Freq: Once | ORAL | Status: AC
  Administered 2022-10-09: 30 mL via ORAL
  Filled 2022-10-09: qty 30

## 2022-10-09 MED ORDER — SUCRALFATE 1 G PO TABS: 1.0000 g | ORAL_TABLET | Freq: Four times a day (QID) | ORAL | 1 refills | Status: DC

## 2022-10-09 MED ORDER — OMEPRAZOLE MAGNESIUM 20 MG PO TBEC: 20.0000 mg | DELAYED_RELEASE_TABLET | Freq: Every day | ORAL | 1 refills | Status: DC

## 2022-10-09 NOTE — ED Triage Notes (Signed)
Pain in abd, sometimes in generalized abd pain and sometimes LUQ. C/o pain x 3 weeks. States that she also has been having changes of coloring in her BMs stating that she's noticed some mild discoloration in it, not c/o diarrhea or constipation, but "black specks" in her stool.  Denies changes in LUQ pain with breathing--just random pain

## 2022-10-09 NOTE — Discharge Instructions (Addendum)
Please see medications as prescribed.  Return to the ER for any fevers, vomiting, severe abdominal pain, worsening symptoms or any urgent changes in your health.  Please call gastroenterologist to schedule follow-up appointment.

## 2022-10-09 NOTE — ED Provider Notes (Signed)
[facility] AT [facility] Provider Note   CSN: [id number] Arrival date & time: 10/09/22  1854     History  Chief Complaint  Patient presents with   Abdominal Pain    Joyce Smith is a 22 y.o. female.  Presents to the emergency department for evaluation of generalized abdominal pain, she describes left upper quadrant and central epigastric intermittent pain worse with eating this been present for 3 weeks.  Pain comes and goes.  She is also noted little bit of change in her bowels, states she noticed a mild black discoloration.  She denies any diarrhea, constipation.  No fevers chills body aches.  No nausea or vomiting.  No bright red blood in the stool.  She has been tolerating p.o. well.  No history of gastric ulcers or reflux.  She has not take any medication for symptoms  HPI     Home Medications Prior to Admission medications   Medication Sig Start Date End Date Taking? Authorizing Provider  omeprazole (PRILOSEC OTC) 20 MG tablet Take 1 tablet (20 mg total) by mouth daily. 10/09/22 10/09/23 Yes Evon Slack, PA-C  sucralfate (CARAFATE) 1 g tablet Take 1 tablet (1 g total) by mouth 4 (four) times daily. 10/09/22 10/09/23 Yes Evon Slack, PA-C  albuterol (VENTOLIN HFA) 108 (90 Base) MCG/ACT inhaler Inhale 2 puffs into the lungs every 6 (six) hours as needed for wheezing or shortness of breath. 07/24/20   Doreene Burke, CNM  budesonide-formoterol (SYMBICORT) 80-4.5 MCG/ACT inhaler Inhale 2 puffs into the lungs daily. 02/03/22   Hildred Laser, MD  escitalopram (LEXAPRO) 10 MG tablet Take 1 tablet (10 mg total) by mouth at bedtime. 07/06/22   Mirna Mires, CNM  ferrous sulfate 324 (65 Fe) MG TBEC Take by mouth.    [provider]  fexofenadine (ALLEGRA) 180 MG tablet Take 180 mg by mouth daily.    [provider]  montelukast (SINGULAIR) 10 MG tablet TAKE 1 TABLET BY MOUTH EVERYDAY AT BEDTIME 10/14/20   Doreene Burke, CNM   NIFEdipine (PROCARDIA-XL/NIFEDICAL-XL) 30 MG 24 hr tablet Take 1 tablet (30 mg total) by mouth daily. 07/06/22   Mirna Mires, CNM  Probiotic Product (PROBIOTIC PO) Take by mouth.    [provider]      Allergies    Patient has no known allergies.    Review of Systems   Review of Systems  Physical Exam Updated Vital Signs BP (!) 157/102 (BP Location: Left Arm)   Pulse 94   Temp (!) 96.9 F (36.1 C) (Oral)   Resp 18   LMP 10/04/2022   SpO2 100%  Physical Exam Constitutional:      Appearance: She is well-developed.  HENT:     Head: Normocephalic and atraumatic.     Mouth/Throat:     Pharynx: No oropharyngeal exudate or posterior oropharyngeal erythema.  Eyes:     Conjunctiva/sclera: Conjunctivae normal.  Cardiovascular:     Rate and Rhythm: Normal rate.  Pulmonary:     Effort: Pulmonary effort is normal. No respiratory distress.  Abdominal:     General: Abdomen is flat. Bowel sounds are normal. There is no distension.     Palpations: Abdomen is soft. There is no mass.     Tenderness: There is no abdominal tenderness. There is no right CVA tenderness, left CVA tenderness or guarding. Negative signs include Murphy's sign and McBurney's sign.  Musculoskeletal:        General: Normal range of  motion.     Cervical back: Normal range of motion.  Skin:    General: Skin is warm.     Findings: No rash.  Neurological:     General: No focal deficit present.     Mental Status: She is alert and oriented to person, place, and time. Mental status is at baseline.  Psychiatric:        Mood and Affect: Mood normal.        Behavior: Behavior normal.        Thought Content: Thought content normal.     ED Results / Procedures / Treatments   Labs (all labs ordered are listed, but only abnormal results are displayed) Labs Reviewed  COMPREHENSIVE METABOLIC PANEL - Abnormal; Notable for the following components:      Result Value   CO2 21 (*)    All other components  within normal limits  CBC - Abnormal; Notable for the following components:   WBC 11.4 (*)    RBC 5.34 (*)    MCH 25.7 (*)    All other components within normal limits  URINALYSIS, ROUTINE W REFLEX MICROSCOPIC - Abnormal; Notable for the following components:   Color, Urine STRAW (*)    APPearance CLEAR (*)    Specific Gravity, Urine 1.004 (*)    All other components within normal limits  LIPASE, BLOOD  POC URINE PREG, ED    EKG None  Radiology No results found.  Procedures Procedures    Medications Ordered in ED Medications - No data to display  ED Course/ Medical Decision Making/ A&P                             Medical Decision Making Amount and/or Complexity of Data Reviewed Labs: ordered.  Risk OTC drugs. Prescription drug management.   22 year old female with several weeks of intermittent abdominal pain.  Worse with eating.  She admits to drinking a lot of coffee and recently turned 22 has been drinking some alcohol.  She has had some irritation of abdominal discomfort.  She has noticed a slight change in her bowels with specks of black.  Her bowels been soft intermittently but is sometimes her bowels are normal.  She denies any blood in her stool.  She is afebrile.  Vital signs are stable.  Blood work was within normal limits.  Only a slightly elevated white count of 11.4 which is normal than her previous labs.  Her abdominal exam is benign, no distention, tenderness, guarding.  Abdomen is soft.  Urinalysis negative for any signs of infection.  Patient started on omeprazole and cervical fate.  She is educated on peptic ulcer diet.  She will avoid alcohol, coffee fatty foods and other foods that are causing aggravation of her abdominal pain.  She will follow-up with gastroenterology she understands signs symptoms return to the ER for. Final Clinical Impression(s) / ED Diagnoses Final diagnoses:  Acute gastric ulcer without hemorrhage or perforation    Rx / DC  Orders ED Discharge Orders          Ordered    sucralfate (CARAFATE) 1 g tablet  4 times daily        10/09/22 2329    omeprazole (PRILOSEC OTC) 20 MG tablet  Daily        10/09/22 2329              Ronnette Juniper 10/09/22 2333    Roxan Hockey,  Luisa Hart, MD 10/09/22 2356

## 2022-11-07 ENCOUNTER — Encounter: Payer: Self-pay | Admitting: Obstetrics and Gynecology

## 2022-11-08 ENCOUNTER — Other Ambulatory Visit: Payer: Self-pay | Admitting: Obstetrics

## 2022-11-08 DIAGNOSIS — O135 Gestational [pregnancy-induced] hypertension without significant proteinuria, complicating the puerperium: Secondary | ICD-10-CM

## 2022-11-09 ENCOUNTER — Emergency Department: Payer: Medicaid Other

## 2022-11-09 ENCOUNTER — Emergency Department
Admission: EM | Admit: 2022-11-09 | Discharge: 2022-11-09 | Disposition: A | Discharge status: Home / Self Care | Payer: Medicaid Other | Attending: Emergency Medicine | Admitting: Emergency Medicine

## 2022-11-09 ENCOUNTER — Encounter: Payer: Self-pay | Admitting: Emergency Medicine

## 2022-11-09 ENCOUNTER — Other Ambulatory Visit: Payer: Self-pay

## 2022-11-09 DIAGNOSIS — I10 Essential (primary) hypertension: Secondary | ICD-10-CM | POA: Insufficient documentation

## 2022-11-09 DIAGNOSIS — O135 Gestational [pregnancy-induced] hypertension without significant proteinuria, complicating the puerperium: Secondary | ICD-10-CM

## 2022-11-09 DIAGNOSIS — Y92410 Unspecified street and highway as the place of occurrence of the external cause: Secondary | ICD-10-CM | POA: Diagnosis not present

## 2022-11-09 DIAGNOSIS — G44319 Acute post-traumatic headache, not intractable: Secondary | ICD-10-CM

## 2022-11-09 DIAGNOSIS — J45909 Unspecified asthma, uncomplicated: Secondary | ICD-10-CM | POA: Insufficient documentation

## 2022-11-09 DIAGNOSIS — R519 Headache, unspecified: Secondary | ICD-10-CM | POA: Insufficient documentation

## 2022-11-09 MED ORDER — TRAMADOL HCL 50 MG PO TABS: 50.0000 mg | ORAL_TABLET | Freq: Two times a day (BID) | ORAL | 0 refills | Status: AC

## 2022-11-09 MED ORDER — NIFEDIPINE ER OSMOTIC RELEASE 30 MG PO TB24
30.0000 mg | ORAL_TABLET | Freq: Every day | ORAL | 2 refills | Status: DC
Start: 2022-11-09 — End: 2023-01-06

## 2022-11-09 MED ORDER — NIFEDIPINE ER OSMOTIC RELEASE 30 MG PO TB24
30.0000 mg | ORAL_TABLET | Freq: Once | ORAL | Status: AC
  Administered 2022-11-09: 30 mg via ORAL
  Filled 2022-11-09: qty 1

## 2022-11-09 MED ORDER — TRAMADOL HCL 50 MG PO TABS
50.0000 mg | ORAL_TABLET | Freq: Once | ORAL | Status: AC
  Administered 2022-11-09: 50 mg via ORAL
  Filled 2022-11-09: qty 1

## 2022-11-09 NOTE — ED Notes (Signed)
Patient transported to CT 
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
## 2022-12-08 ENCOUNTER — Ambulatory Visit
Admission: EM | Admit: 2022-12-08 | Discharge: 2022-12-08 | Disposition: A | Discharge status: Home / Self Care | Payer: Medicaid Other | Attending: Physician Assistant | Admitting: Physician Assistant

## 2022-12-08 DIAGNOSIS — N760 Acute vaginitis: Secondary | ICD-10-CM | POA: Diagnosis present

## 2022-12-08 DIAGNOSIS — N898 Other specified noninflammatory disorders of vagina: Secondary | ICD-10-CM

## 2022-12-08 LAB — WET PREP, GENITAL
Sperm: NONE SEEN
Trich, Wet Prep: NONE SEEN
WBC, Wet Prep HPF POC: <10 (ref <10)
Yeast Wet Prep HPF POC: NONE SEEN

## 2022-12-08 MED ORDER — METRONIDAZOLE 500 MG PO TABS: 500.0000 mg | ORAL_TABLET | Freq: Two times a day (BID) | ORAL | 0 refills | Status: AC

## 2022-12-08 MED ORDER — FLUCONAZOLE 150 MG PO TABS
150.0000 mg | ORAL_TABLET | Freq: Once | ORAL | 0 refills | Status: AC
Start: 2022-12-08 — End: 2022-12-08
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
## 2022-12-08 NOTE — ED Triage Notes (Addendum)
Pt presents to UC c/o "vaginal discomfort" onset, pt reports itching, and discharge. Pt states she has 1 more dose of abx and thinks it caused yeast infection. Pt denies any concern for any STDs.

## 2022-12-08 NOTE — Discharge Instructions (Signed)

## 2023-01-05 ENCOUNTER — Other Ambulatory Visit (HOSPITAL_COMMUNITY)
Admission: RE | Admit: 2023-01-05 | Discharge: 2023-01-05 | Disposition: A | Discharge status: Home / Self Care | Payer: Medicaid Other | Source: Ambulatory Visit | Attending: Obstetrics | Admitting: Obstetrics

## 2023-01-05 ENCOUNTER — Ambulatory Visit (INDEPENDENT_AMBULATORY_CARE_PROVIDER_SITE_OTHER): Payer: Medicaid Other

## 2023-01-05 VITALS — BP 137/81 | HR 106 | Ht 64.0 in | Wt 268.6 lb

## 2023-01-05 DIAGNOSIS — N898 Other specified noninflammatory disorders of vagina: Secondary | ICD-10-CM

## 2023-01-05 LAB — POCT URINALYSIS DIPSTICK
Bilirubin, UA: NEGATIVE
Glucose, UA: NEGATIVE
Ketones, UA: NEGATIVE
Leukocytes, UA: NEGATIVE
Nitrite, UA: NEGATIVE
Protein, UA: NEGATIVE
Spec Grav, UA: <=1.005 — AB (ref 1.010–1.025)
Urobilinogen, UA: 0.2 E.U./dL
pH, UA: 7.5 (ref 5.0–8.0)

## 2023-01-05 NOTE — Patient Instructions (Signed)
Vaginitis  Vaginitis is irritation and swelling of the vagina. Treatment will depend on the cause. What are the causes? It can be caused by: Bacteria. Yeast. A parasite. A virus. Low hormone levels. Bubble baths, scented tampons, and feminine sprays. Other things can change the balance of the yeast and bacteria that live in the vagina. These include: Antibiotic medicines. Not being clean enough. Some birth control methods. Sex. Infection. Diabetes. A weakened body defense system (immune system). What increases the risk? Smoking or being around someone who smokes. Using washes (douches), scented tampons, or scented pads. Wearing tight pants or thong underwear. Using birth control pills or an IUD. Having sex without a condom or having a lot of partners. Having an STI. Using a certain product to kill sperm (nonoxynol-9). Eating foods that are high in sugar. Having diabetes. Having low levels of a female hormone. Having a weakened body defense system. Being pregnant or breastfeeding. What are the signs or symptoms? Fluid coming from the vagina that is not normal. A bad smell. Itching, pain, or swelling. Pain with sex. Pain or burning when you pee (urinate). Sometimes there are no symptoms. How is this treated? Treatment may include: Antibiotic creams or pills. Antifungal medicines. Medicines to ease symptoms if you have a virus. Your sex partner should also be treated. Estrogen medicines. Avoiding scented soaps, sprays, or douches. Stopping use of products that caused irritation and then using a cream to treat symptoms. Follow these instructions at home: Lifestyle Keep the area around your vagina clean and dry. Avoid using soap. Rinse the area with water. Until your doctor says it is okay: Do not use washes for the vagina. Do not use tampons. Do not have sex. Wipe from front to back after going to the bathroom. When your doctor says it is okay, practice safe sex  and use condoms. General instructions Take over-the-counter and prescription medicines only as told by your doctor. If you were prescribed an antibiotic medicine, take or use it as told by your doctor. Do not stop taking or using it even if you start to feel better. Keep all follow-up visits. How is this prevented? Do not use things that can irritate the vagina, such as fabric softeners. Avoid these products if they are scented: Sprays. Detergents. Tampons. Products for cleaning the vagina. Soaps or bubble baths. Let air reach your vagina. To do this: Wear cotton underwear. Do not wear: Underwear while you sleep. Tight pants. Thong underwear. Underwear or nylons without a cotton panel. Take off any wet clothing, such as bathing suits, as soon as you can. Practice safe sex and use condoms. Contact a doctor if: You have pain in your belly or in the area between your hips. You have a fever or chills. Your symptoms last for more than 2-3 days. Get help right away if: You have a fever and your symptoms get worse all of a sudden. Summary Vaginitis is irritation and swelling of the vagina. Treatment will depend on the cause of the condition. Do not use washes or tampons or have sex until your doctor says it is okay. This information is not intended to replace advice given to you by your health care provider. Make sure you discuss any questions you have with your health care provider. Document Revised: 10/26/2019 Document Reviewed: 10/26/2019 Elsevier Patient Education  2024 [employer].

## 2023-01-05 NOTE — Progress Notes (Signed)
    NURSE VISIT NOTE  Subjective:    Patient ID: Joyce Smith, female    DOB: 10-May-2001, 22 y.o.   MRN: [id number]       HPI  Patient is a 22 y.o. G50P1011 female who presents for vaginal irritation for 3 days.  Patient denies dysuria, hematuria, urinary frequency, urinary urgency, flank pain, abdominal pain, pelvic pain, cloudy malordorous urine, genital rash, and vaginal discharge.  Patient does not have a history of recurrent UTI.  Patient does not have a history of pyelonephritis. Patient states that she is currently on her menstrual cycle and states that 3 days ago while changing pad she had noticed that pad seemed to be falling apart, patient states that she has concerns that padding from pad or gel inserts in pad caused her to have vaginal irritation. Patient is requesting today vaginal swab and urinalysis to rule out infection.    Objective:    BP 137/81   Pulse (!) 106   Ht 5\' 4"  (1.626 m)   Wt 268 lb 9.6 oz (121.8 kg)   LMP 01/03/2023 (Approximate)   Breastfeeding No   BMI 46.11 kg/m    Lab Review  Results for orders placed or performed in visit on 01/05/23  POCT urinalysis dipstick  Result Value Ref Range   Color, UA dark yellow    Clarity, UA clear    Glucose, UA Negative Negative   Bilirubin, UA negative    Ketones, UA negative    Spec Grav, UA <=1.005 (A) 1.010 - 1.025   Blood, UA large    pH, UA 7.5 5.0 - 8.0   Protein, UA Negative Negative   Urobilinogen, UA 0.2 0.2 or 1.0 E.U./dL   Nitrite, UA negative    Leukocytes, UA Negative Negative   Appearance     Odor      Assessment:   1. Vaginal irritation      Plan:   Maintain adequate hydration.  Follow up if symptoms worsen or fail to improve as anticipated, and as needed.    Fonda Kinder, CMA

## 2023-01-06 ENCOUNTER — Ambulatory Visit (INDEPENDENT_AMBULATORY_CARE_PROVIDER_SITE_OTHER): Payer: Medicaid Other | Admitting: Obstetrics and Gynecology

## 2023-01-06 ENCOUNTER — Encounter: Payer: Self-pay | Admitting: Obstetrics and Gynecology

## 2023-01-06 VITALS — BP 139/86 | HR 101 | Ht 64.0 in | Wt 272.4 lb

## 2023-01-06 DIAGNOSIS — B3731 Acute candidiasis of vulva and vagina: Secondary | ICD-10-CM

## 2023-01-06 DIAGNOSIS — I10 Essential (primary) hypertension: Secondary | ICD-10-CM | POA: Diagnosis not present

## 2023-01-06 DIAGNOSIS — N898 Other specified noninflammatory disorders of vagina: Secondary | ICD-10-CM

## 2023-01-06 DIAGNOSIS — R102 Pelvic and perineal pain: Secondary | ICD-10-CM | POA: Diagnosis not present

## 2023-01-06 LAB — CERVICOVAGINAL ANCILLARY ONLY
Bacterial Vaginitis (gardnerella): NEGATIVE
Candida Glabrata: NEGATIVE
Candida Vaginitis: NEGATIVE
Chlamydia: NEGATIVE
Comment: NEGATIVE
Comment: NEGATIVE
Comment: NEGATIVE
Comment: NEGATIVE
Comment: NEGATIVE
Comment: NORMAL
Neisseria Gonorrhea: NEGATIVE
Trichomonas: NEGATIVE

## 2023-01-06 MED ORDER — FLUCONAZOLE 150 MG PO TABS: 150.0000 mg | ORAL_TABLET | ORAL | 3 refills | Status: DC

## 2023-01-06 MED ORDER — NIFEDIPINE ER 60 MG PO TB24: 60.0000 mg | ORAL_TABLET | Freq: Every day | ORAL | 1 refills | Status: DC
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
## 2023-01-06 NOTE — Progress Notes (Signed)
Patient presents today due to vaginal irritation. She states waking up Monday realizing that she did not change her pad the night before and it was coming apart when she saw it that morning. Since then she reports pain and burning. She completed a self culture yesterday, results are still pending.

## 2023-01-07 ENCOUNTER — Encounter: Payer: Self-pay | Admitting: Obstetrics and Gynecology

## 2023-02-09 ENCOUNTER — Ambulatory Visit
Admission: EM | Admit: 2023-02-09 | Discharge: 2023-02-09 | Disposition: A | Discharge status: Home / Self Care | Payer: Medicaid Other | Attending: Emergency Medicine | Admitting: Emergency Medicine

## 2023-02-09 DIAGNOSIS — B9689 Other specified bacterial agents as the cause of diseases classified elsewhere: Secondary | ICD-10-CM

## 2023-02-09 DIAGNOSIS — N760 Acute vaginitis: Secondary | ICD-10-CM | POA: Diagnosis present

## 2023-02-09 HISTORY — DX: Other specified bacterial agents as the cause of diseases classified elsewhere: N76.0

## 2023-02-09 HISTORY — DX: Other specified bacterial agents as the cause of diseases classified elsewhere: B96.89

## 2023-02-09 LAB — WET PREP, GENITAL
Sperm: NONE SEEN
Trich, Wet Prep: NONE SEEN
WBC, Wet Prep HPF POC: <10 (ref <10)
Yeast Wet Prep HPF POC: NONE SEEN

## 2023-02-09 MED ORDER — METRONIDAZOLE 500 MG PO TABS: 500.0000 mg | ORAL_TABLET | Freq: Two times a day (BID) | ORAL | 0 refills | Status: DC

## 2023-02-09 NOTE — ED Triage Notes (Signed)
Patient states that she is having vaginal itching on the outside of her vagina. Isnt sure if yeast bv or where she shaved. No abnormal discharge. Has a smell. Doesn't want STD testing.
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
## 2023-02-09 NOTE — Discharge Instructions (Signed)
Drink plenty of water, take antibiotic as directed, avoid alcohol while taking this medication as will make you nauseated.  Follow-up with your OB/GYN, return as needed.

## 2023-02-17 ENCOUNTER — Ambulatory Visit: Payer: Medicaid Other | Admitting: Obstetrics and Gynecology

## 2023-02-17 ENCOUNTER — Encounter: Payer: Self-pay | Admitting: Obstetrics and Gynecology

## 2023-02-17 VITALS — BP 143/83 | HR 108 | Ht 64.0 in | Wt 276.0 lb

## 2023-02-17 DIAGNOSIS — N898 Other specified noninflammatory disorders of vagina: Secondary | ICD-10-CM | POA: Diagnosis not present
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
## 2023-02-17 NOTE — Progress Notes (Signed)
Patient presents today to discussing reoccurring BV. She states recently finishing one week of Flagyl on Monday but remains symptomatic.

## 2023-03-03 ENCOUNTER — Ambulatory Visit: Payer: Medicaid Other

## 2023-03-03 VITALS — BP 131/70 | HR 99 | Ht 64.0 in | Wt 272.0 lb

## 2023-03-03 DIAGNOSIS — Z3201 Encounter for pregnancy test, result positive: Secondary | ICD-10-CM | POA: Diagnosis not present

## 2023-03-03 DIAGNOSIS — N912 Amenorrhea, unspecified: Secondary | ICD-10-CM

## 2023-03-03 LAB — POCT URINE PREGNANCY: Preg Test, Ur: POSITIVE — AB

## 2023-03-03 NOTE — Progress Notes (Signed)
    NURSE VISIT NOTE  Subjective:    Patient ID: Joyce Smith, female    DOB: 10-17-2000, 22 y.o.   MRN: [id number]  HPI  Patient is a 22 y.o. G5P1011 female who presents for evaluation of amenorrhea. She believes she could be pregnant. Pregnancy is desired. Sexual Activity: has sex with males. Current symptoms also include: breast tenderness, fatigue, and positive home pregnancy test. Last period was normal. Patient stated she is currently being treated for a stomach ulcer, taking sucralfate and has to start omeprazole, is it safe to take? Checked with Doreene Burke, CNM and she okayed both medications.    Objective:    BP 131/70   Pulse 99   Ht 5\' 4"  (1.626 m)   Wt 272 lb (123.4 kg)   LMP 01/30/2023 (Exact Date)   Breastfeeding No   BMI 46.69 kg/m   Lab Review  Results for orders placed or performed in visit on 03/03/23  POCT urine pregnancy  Result Value Ref Range   Preg Test, Ur Positive (A) Negative    Assessment:   1. Amenorrhea     Plan:   Pregnancy Test: Positive  Estimated Date of Delivery: 11/06/23 Encouraged well-balanced diet, plenty of rest when needed, pre-natal vitamins daily and walking for exercise.  Used Large Adult Cuff. Discussed self-help for nausea, avoiding OTC medications until consulting provider or pharmacist, other than Tylenol as needed, minimal caffeine (1-2 cups daily) and avoiding alcohol.   She will schedule her nurse visit @ 7-[redacted] wks pregnant, u/s for dating @10  wk, and NOB visit at [redacted] wk pregnant.    Feel free to call with any questions.     Donnetta Hail, CMA

## 2023-03-03 NOTE — Patient Instructions (Signed)

## 2023-03-18 ENCOUNTER — Other Ambulatory Visit: Payer: Self-pay | Admitting: Obstetrics and Gynecology

## 2023-03-23 ENCOUNTER — Telehealth: Payer: Medicaid Other

## 2023-03-26 ENCOUNTER — Telehealth (INDEPENDENT_AMBULATORY_CARE_PROVIDER_SITE_OTHER): Payer: Medicaid Other

## 2023-03-26 ENCOUNTER — Other Ambulatory Visit: Payer: Self-pay

## 2023-03-26 ENCOUNTER — Other Ambulatory Visit: Payer: Medicaid Other

## 2023-03-26 DIAGNOSIS — Z3480 Encounter for supervision of other normal pregnancy, unspecified trimester: Secondary | ICD-10-CM | POA: Insufficient documentation

## 2023-03-26 DIAGNOSIS — Z3401 Encounter for supervision of normal first pregnancy, first trimester: Secondary | ICD-10-CM

## 2023-03-26 DIAGNOSIS — Z369 Encounter for antenatal screening, unspecified: Secondary | ICD-10-CM

## 2023-03-26 DIAGNOSIS — Z3A01 Less than 8 weeks gestation of pregnancy: Secondary | ICD-10-CM

## 2023-03-26 DIAGNOSIS — Z3689 Encounter for other specified antenatal screening: Secondary | ICD-10-CM

## 2023-03-26 NOTE — Patient Instructions (Signed)
Second Trimester of Pregnancy  The second trimester of pregnancy is from week 13 through week 27. This is months 4 through 6 of pregnancy. The second trimester is often a time when you feel your best. Your body has adjusted to being pregnant, and you begin to feel better physically. During the second trimester: Morning sickness has lessened or stopped completely. You may have more energy. You may have an increase in appetite. The second trimester is also a time when the unborn baby (fetus) is growing rapidly. At the end of the sixth month, the fetus may be up to 12 inches long and weigh about 1 pounds. You will likely begin to feel the baby move (quickening) between 16 and 20 weeks of pregnancy. Body changes during your second trimester Your body continues to go through many changes during your second trimester. The changes vary and generally return to normal after the baby is born. Physical changes Your weight will continue to increase. You will notice your lower abdomen bulging out. You may begin to get stretch marks on your hips, abdomen, and breasts. Your breasts will continue to grow and to become tender. Dark spots or blotches (chloasma or mask of pregnancy) may develop on your face. A dark line from your belly button to the pubic area (linea nigra) may appear. You may have changes in your hair. These can include thickening of your hair, rapid growth, and changes in texture. Some people also have hair loss during or after pregnancy, or hair that feels dry or thin. Health changes You may develop headaches. You may have heartburn. You may develop constipation. You may develop hemorrhoids or swollen, bulging veins (varicose veins). Your gums may bleed and may be sensitive to brushing and flossing. You may urinate more often because the fetus is pressing on your bladder. You may have back pain. This is caused by: Weight gain. Pregnancy hormones that are relaxing the joints in your  pelvis. A shift in weight and the muscles that support your balance. Follow these instructions at home: Medicines Follow your health care provider's instructions regarding medicine use. Specific medicines may be either safe or unsafe to take during pregnancy. Do not take any medicines unless approved by your health care provider. Take a prenatal vitamin that contains at least 600 micrograms (mcg) of folic acid. Eating and drinking Eat a healthy diet that includes fresh fruits and vegetables, whole grains, good sources of protein such as meat, eggs, or tofu, and low-fat dairy products. Avoid raw meat and unpasteurized juice, milk, and cheese. These carry germs that can harm you and your baby. You may need to take these actions to prevent or treat constipation: Drink enough fluid to keep your urine pale yellow. Eat foods that are high in fiber, such as beans, whole grains, and fresh fruits and vegetables. Limit foods that are high in fat and processed sugars, such as fried or sweet foods. Activity Exercise only as directed by your health care provider. Most people can continue their usual exercise routine during pregnancy. Try to exercise for 30 minutes at least 5 days a week. Stop exercising if you develop contractions in your uterus. Stop exercising if you develop pain or cramping in the lower abdomen or lower back. Avoid exercising if it is very hot or humid or if you are at a high altitude. Avoid heavy lifting. If you choose to, you may have sex unless your health care provider tells you not to. Relieving pain and discomfort Wear a supportive   bra to prevent discomfort from breast tenderness. Take warm sitz baths to soothe any pain or discomfort caused by hemorrhoids. Use hemorrhoid cream if your health care provider approves. Rest with your legs raised (elevated) if you have leg cramps or low back pain. If you develop varicose veins: Wear support hose as told by your health care  provider. Elevate your feet for 15 minutes, 3-4 times a day. Limit salt in your diet. Safety Wear your seat belt at all times when driving or riding in a car. Talk with your health care provider if someone is verbally or physically abusive to you. Lifestyle Do not use hot tubs, steam rooms, or saunas. Do not douche. Do not use tampons or scented sanitary pads. Avoid cat litter boxes and soil used by cats. These carry germs that can cause birth defects in the baby and possibly loss of the fetus by miscarriage or stillbirth. Do not use herbal remedies, alcohol, illegal drugs, or medicines that are not approved by your health care provider. Chemicals in these products can harm your baby. Do not use any products that contain nicotine or tobacco, such as cigarettes, e-cigarettes, and chewing tobacco. If you need help quitting, ask your health care provider. General instructions During a routine prenatal visit, your health care provider will do a physical exam and other tests. He or she will also discuss your overall health. Keep all follow-up visits. This is important. Ask your health care provider for a referral to a local prenatal education class. Ask for help if you have counseling or nutritional needs during pregnancy. Your health care provider can offer advice or refer you to specialists for help with various needs. Where to find more information American Pregnancy Association: americanpregnancy.org American College of Obstetricians and Gynecologists: acog.org/en/Womens%20Health/Pregnancy Office on Women's Health: womenshealth.gov/pregnancy Contact a health care provider if you have: A headache that does not go away when you take medicine. Vision changes or you see spots in front of your eyes. Mild pelvic cramps, pelvic pressure, or nagging pain in the abdominal area. Persistent nausea, vomiting, or diarrhea. A bad-smelling vaginal discharge or foul-smelling urine. Pain when you  urinate. Sudden or extreme swelling of your face, hands, ankles, feet, or legs. A fever. Get help right away if you: Have fluid leaking from your vagina. Have spotting or bleeding from your vagina. Have severe abdominal cramping or pain. Have difficulty breathing. Have chest pain. Have fainting spells. Have not felt your baby move for the time period told by your health care provider. Have new or increased pain, swelling, or redness in an arm or leg. Summary The second trimester of pregnancy is from week 13 through week 27 (months 4 through 6). Do not use herbal remedies, alcohol, illegal drugs, or medicines that are not approved by your health care provider. Chemicals in these products can harm your baby. Exercise only as directed by your health care provider. Most people can continue their usual exercise routine during pregnancy. Keep all follow-up visits. This is important. This information is not intended to replace advice given to you by your health care provider. Make sure you discuss any questions you have with your health care provider. Document Revised: 10/04/2019 Document Reviewed: 08/10/2019 Elsevier Patient Education  2024 [employer] First Trimester of Pregnancy  The first trimester of pregnancy starts on the first day of your last menstrual period until the end of week 12. This is also called months 1 through 3 of pregnancy. Body changes during your first trimester Your   body goes through many changes during pregnancy. The changes usually return to normal after your baby is born. Physical changes You may gain or lose weight. Your breasts may grow larger and hurt. The area around your nipples may get darker. Dark spots or blotches may develop on your face. You may have changes in your hair. Health changes You may feel like you might vomit (nauseous), and you may vomit. You may have heartburn. You may have headaches. You may have trouble pooping (constipation). Your  gums may bleed. Other changes You may get tired easily. You may pee (urinate) more often. Your menstrual periods will stop. You may not feel hungry. You may want to eat certain kinds of food. You may have changes in your emotions from day to day. You may have more dreams. Follow these instructions at home: Medicines Take over-the-counter and prescription medicines only as told by your doctor. Some medicines are not safe during pregnancy. Take a prenatal vitamin that contains at least 600 micrograms (mcg) of folic acid. Eating and drinking Eat healthy meals that include: Fresh fruits and vegetables. Whole grains. Good sources of protein, such as meat, eggs, or tofu. Low-fat dairy products. Avoid raw meat and unpasteurized juice, milk, and cheese. If you feel like you may vomit, or you vomit: Eat 4 or 5 small meals a day instead of 3 large meals. Try eating a few soda crackers. Drink liquids between meals instead of during meals. You may need to take these actions to prevent or treat trouble pooping: Drink enough fluids to keep your pee (urine) pale yellow. Eat foods that are high in fiber. These include beans, whole grains, and fresh fruits and vegetables. Limit foods that are high in fat and sugar. These include fried or sweet foods. Activity Exercise only as told by your doctor. Most people can do their usual exercise routine during pregnancy. Stop exercising if you have cramps or pain in your lower belly (abdomen) or low back. Do not exercise if it is too hot or too humid, or if you are in a place of great height (high altitude). Avoid heavy lifting. If you choose to, you may have sex unless your doctor tells you not to. Relieving pain and discomfort Wear a good support bra if your breasts are sore. Rest with your legs raised (elevated) if you have leg cramps or low back pain. If you have bulging veins (varicose veins) in your legs: Wear support hose as told by your  doctor. Raise your feet for 15 minutes, 3-4 times a day. Limit salt in your food. Safety Wear your seat belt at all times when you are in a car. Talk with your doctor if someone is hurting you or yelling at you. Talk with your doctor if you are feeling sad or have thoughts of hurting yourself. Lifestyle Do not use hot tubs, steam rooms, or saunas. Do not douche. Do not use tampons or scented sanitary pads. Do not use herbal medicines, illegal drugs, or medicines that are not approved by your doctor. Do not drink alcohol. Do not smoke or use any products that contain nicotine or tobacco. If you need help quitting, ask your doctor. Avoid cat litter boxes and soil that is used by cats. These carry germs that can cause harm to the baby and can cause a loss of your baby by miscarriage or stillbirth. General instructions Keep all follow-up visits. This is important. Ask for help if you need counseling or if you need help with   nutrition. Your doctor can give you advice or tell you where to go for help. Visit your dentist. At home, brush your teeth with a soft toothbrush. Floss gently. Write down your questions. Take them to your prenatal visits. Where to find more information American Pregnancy Association: americanpregnancy.org American College of Obstetricians and Gynecologists: www.acog.org Office on Women's Health: womenshealth.gov/pregnancy Contact a doctor if: You are dizzy. You have a fever. You have mild cramps or pressure in your lower belly. You have a nagging pain in your belly area. You continue to feel like you may vomit, you vomit, or you have watery poop (diarrhea) for 24 hours or longer. You have a bad-smelling fluid coming from your vagina. You have pain when you pee. You are exposed to a disease that spreads from person to person, such as chickenpox, measles, Zika virus, HIV, or hepatitis. Get help right away if: You have spotting or bleeding from your vagina. You have  very bad belly cramping or pain. You have shortness of breath or chest pain. You have any kind of injury, such as from a fall or a car crash. You have new or increased pain, swelling, or redness in an arm or leg. Summary The first trimester of pregnancy starts on the first day of your last menstrual period until the end of week 12 (months 1 through 3). Eat 4 or 5 small meals a day instead of 3 large meals. Do not smoke or use any products that contain nicotine or tobacco. If you need help quitting, ask your doctor. Keep all follow-up visits. This information is not intended to replace advice given to you by your health care provider. Make sure you discuss any questions you have with your health care provider. Document Revised: 10/04/2019 Document Reviewed: 08/10/2019 Elsevier Patient Education  2024 Elsevier Inc. Commonly Asked Questions During Pregnancy  Cats: A parasite can be excreted in cat feces.  To avoid exposure you need to have another person empty the little box.  If you must empty the litter box you will need to wear gloves.  Wash your hands after handling your cat.  This parasite can also be found in raw or undercooked meat so this should also be avoided.  Colds, Sore Throats, Flu: Please check your medication sheet to see what you can take for symptoms.  If your symptoms are unrelieved by these medications please call the office.  Dental Work: Most any dental work your dentist recommends is permitted.  X-rays should only be taken during the first trimester if absolutely necessary.  Your abdomen should be shielded with a lead apron during all x-rays.  Please notify your provider prior to receiving any x-rays.  Novocaine is fine; gas is not recommended.  If your dentist requires a note from us prior to dental work please call the office and we will provide one for you.  Exercise: Exercise is an important part of staying healthy during your pregnancy.  You may continue most exercises  you were accustomed to prior to pregnancy.  Later in your pregnancy you will most likely notice you have difficulty with activities requiring balance like riding a bicycle.  It is important that you listen to your body and avoid activities that put you at a higher risk of falling.  Adequate rest and staying well hydrated are a must!  If you have questions about the safety of specific activities ask your provider.    Exposure to Children with illness: Try to avoid obvious exposure; report any   symptoms to us when noted,  If you have chicken pos, red measles or mumps, you should be immune to these diseases.   Please do not take any vaccines while pregnant unless you have checked with your OB provider.  Fetal Movement: After 28 weeks we recommend you do "kick counts" twice daily.  Lie or sit down in a calm quiet environment and count your baby movements "kicks".  You should feel your baby at least 10 times per hour.  If you have not felt 10 kicks within the first hour get up, walk around and have something sweet to eat or drink then repeat for an additional hour.  If count remains less than 10 per hour notify your provider.  Fumigating: Follow your pest control agent's advice as to how long to stay out of your home.  Ventilate the area well before re-entering.  Hemorrhoids:   Most over-the-counter preparations can be used during pregnancy.  Check your medication to see what is safe to use.  It is important to use a stool softener or fiber in your diet and to drink lots of liquids.  If hemorrhoids seem to be getting worse please call the office.   Hot Tubs:  Hot tubs Jacuzzis and saunas are not recommended while pregnant.  These increase your internal body temperature and should be avoided.  Intercourse:  Sexual intercourse is safe during pregnancy as long as you are comfortable, unless otherwise advised by your provider.  Spotting may occur after intercourse; report any bright red bleeding that is heavier  than spotting.  Labor:  If you know that you are in labor, please go to the hospital.  If you are unsure, please call the office and let us help you decide what to do.  Lifting, straining, etc:  If your job requires heavy lifting or straining please check with your provider for any limitations.  Generally, you should not lift items heavier than that you can lift simply with your hands and arms (no back muscles)  Painting:  Paint fumes do not harm your pregnancy, but may make you ill and should be avoided if possible.  Latex or water based paints have less odor than oils.  Use adequate ventilation while painting.  Permanents & Hair Color:  Chemicals in hair dyes are not recommended as they cause increase hair dryness which can increase hair loss during pregnancy.  " Highlighting" and permanents are allowed.  Dye may be absorbed differently and permanents may not hold as well during pregnancy.  Sunbathing:  Use a sunscreen, as skin burns easily during pregnancy.  Drink plenty of fluids; avoid over heating.  Tanning Beds:  Because their possible side effects are still unknown, tanning beds are not recommended.  Ultrasound Scans:  Routine ultrasounds are performed at approximately 20 weeks.  You will be able to see your baby's general anatomy an if you would like to know the gender this can usually be determined as well.  If it is questionable when you conceived you may also receive an ultrasound early in your pregnancy for dating purposes.  Otherwise ultrasound exams are not routinely performed unless there is a medical necessity.  Although you can request a scan we ask that you pay for it when conducted because insurance does not cover " patient request" scans.  Work: If your pregnancy proceeds without complications you may work until your due date, unless your physician or employer advises otherwise.  Round Ligament Pain/Pelvic Discomfort:  Sharp, shooting pains not associated   with bleeding are  fairly common, usually occurring in the second trimester of pregnancy.  They tend to be worse when standing up or when you remain standing for long periods of time.  These are the result of pressure of certain pelvic ligaments called "round ligaments".  Rest, Tylenol and heat seem to be the most effective relief.  As the womb and fetus grow, they rise out of the pelvis and the discomfort improves.  Please notify the office if your pain seems different than that described.  It may represent a more serious condition.  Common Medications Safe in Pregnancy  Acne:      Constipation:  Benzoyl Peroxide     Colace  Clindamycin      Dulcolax Suppository  Topica Erythromycin     Fibercon  Salicylic Acid      Metamucil         Miralax AVOID:        Senakot   Accutane    Cough:  Retin-A       Cough Drops  Tetracycline      Phenergan w/ Codeine if Rx  Minocycline      Robitussin (Plain & DM)  Antibiotics:     Crabs/Lice:  Ceclor       RID  Cephalosporins    AVOID:  E-Mycins      Kwell  Keflex  Macrobid/Macrodantin   Diarrhea:  Penicillin      Kao-Pectate  Zithromax      Imodium AD         PUSH FLUIDS AVOID:       Cipro     Fever:  Tetracycline      Tylenol (Regular or Extra  Minocycline       Strength)  Levaquin      Extra Strength-Do not          Exceed 8 tabs/24 hrs Caffeine:        <200mg/day (equiv. To 1 cup of coffee or  approx. 3 12 oz sodas)         Gas: Cold/Hayfever:       Gas-X  Benadryl      Mylicon  Claritin       Phazyme  **Claritin-D        Chlor-Trimeton    Headaches:  Dimetapp      ASA-Free Excedrin  Drixoral-Non-Drowsy     Cold Compress  Mucinex (Guaifenasin)     Tylenol (Regular or Extra  Sudafed/Sudafed-12 Hour     Strength)  **Sudafed PE Pseudoephedrine   Tylenol Cold & Sinus     Vicks Vapor Rub  Zyrtec  **AVOID if Problems With Blood Pressure         Heartburn: Avoid lying down for at least 1 hour after meals  Aciphex      Maalox     Rash:  Milk of  Magnesia     Benadryl    Mylanta       1% Hydrocortisone Cream  Pepcid  Pepcid Complete   Sleep Aids:  Prevacid      Ambien   Prilosec       Benadryl  Rolaids       Chamomile Tea  Tums (Limit 4/day)     Unisom         Tylenol PM         Warm milk-add vanilla or  Hemorrhoids:       Sugar for taste  Anusol/Anusol H.C.  (RX: Analapram 2.5%)  Sugar Substitutes:    Hydrocortisone OTC     Ok in moderation  Preparation H      Tucks        Vaseline lotion applied to tissue with wiping    Herpes:     Throat:  Acyclovir      Oragel  Famvir  Valtrex     Vaccines:         Flu Shot Leg Cramps:       *Gardasil  Benadryl      Hepatitis A         Hepatitis B Nasal Spray:       Pneumovax  Saline Nasal Spray     Polio Booster         Tetanus Nausea:       Tuberculosis test or PPD  Vitamin B6 25 mg TID   AVOID:    Dramamine      *Gardasil  Emetrol       Live Poliovirus  Ginger Root 250 mg QID    MMR (measles, mumps &  High Complex Carbs @ Bedtime    rebella)  Sea Bands-Accupressure    Varicella (Chickenpox)  Unisom 1/2 tab TID     *No known complications           If received before Pain:         Known pregnancy;   Darvocet       Resume series after  Lortab        Delivery  Percocet    Yeast:   Tramadol      Femstat  Tylenol 3      Gyne-lotrimin  Ultram       Monistat  Vicodin           MISC:         All Sunscreens           Hair Coloring/highlights          Insect Repellant's          (Including DEET)         Mystic Tans  

## 2023-03-26 NOTE — Progress Notes (Signed)
New OB Intake  I connected with  Joyce Smith on 03/26/23 at 11:15 AM EST by Video Visit and verified that I am speaking with the correct person using two identifiers. Nurse is located at [facility] and pt is located at car.  I explained I am completing New OB Intake today. We discussed her EDD of 11/06/2023 that is based on LMP of 01/30/2023. Pt is G3/P1011. I reviewed her allergies, medications, Medical/Surgical/OB history, and appropriate screenings. There are no cats in the home.  Based on history, this is a/an pregnancy uncomplicated .   Patient Active Problem List   Diagnosis Date Noted   Supervision of other normal pregnancy, antepartum 03/26/2023   Hypertension 08/27/2021   Asthma 01/30/2019   Depression dx'd 2015 01/30/2019   Allergic rhinitis 03/16/2017    Concerns addressed today None  Delivery Plans:  Plans to deliver at [facility].  Anatomy US Explained first scheduled [place] was this morning and an anatomy scan will be done at 20 weeks.  Labs Discussed genetic screening with patient. Patient desires genetic testing to be drawn at new OB visit. Discussed possible labs to be drawn at new OB appointment.  COVID Vaccine Patient has not had COVID vaccine.   Social Determinants of Health Food Insecurity: denies food insecurity Transportation: Patient denies transportation needs. Childcare: Discussed no children allowed at ultrasound appointments.   First visit review I reviewed new OB appt with pt. I explained she will have ob bloodwork and pap smear/pelvic exam if indicated. Explained pt will be seen by Acquanetta Sit, CNM at first visit; encounter routed to appropriate provider.   Loran Senters, [facility] 03/26/2023  11:45 AM
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
## 2023-03-30 ENCOUNTER — Ambulatory Visit (INDEPENDENT_AMBULATORY_CARE_PROVIDER_SITE_OTHER): Payer: Medicaid Other | Admitting: Physician Assistant

## 2023-03-30 ENCOUNTER — Encounter: Payer: Self-pay | Admitting: Physician Assistant

## 2023-03-30 VITALS — BP 138/84 | HR 76 | Temp 98.3°F | Ht 64.0 in | Wt 274.8 lb

## 2023-03-30 DIAGNOSIS — K219 Gastro-esophageal reflux disease without esophagitis: Secondary | ICD-10-CM

## 2023-03-30 DIAGNOSIS — R195 Other fecal abnormalities: Secondary | ICD-10-CM

## 2023-03-30 DIAGNOSIS — R1012 Left upper quadrant pain: Secondary | ICD-10-CM | POA: Diagnosis not present

## 2023-03-30 DIAGNOSIS — R0781 Pleurodynia: Secondary | ICD-10-CM

## 2023-03-30 DIAGNOSIS — O0001 Abdominal pregnancy with intrauterine pregnancy: Secondary | ICD-10-CM

## 2023-03-30 DIAGNOSIS — G8929 Other chronic pain: Secondary | ICD-10-CM

## 2023-03-31 ENCOUNTER — Encounter: Payer: Self-pay | Admitting: Physician Assistant

## 2023-03-31 ENCOUNTER — Telehealth: Payer: Self-pay

## 2023-03-31 NOTE — Telephone Encounter (Signed)
Pt calling to say she is feeling very very tired, shakey, cold, weak in a sense; states sxs go away if she eats/drinks something sugary; eating real food like a protein doesn't help at all.  She had low iron last preg and is concerned.  Adv pt next time to try eating a protein and be sure she is drinking enough water; she states it doesn't help.  Adv will send msg to provider.

## 2023-04-01 NOTE — Telephone Encounter (Signed)
Pt aware.

## 2023-04-02 LAB — CBC WITH DIFFERENTIAL/PLATELET
Basophils Absolute: 0.1 10*3/uL (ref 0.0–0.2)
Basos: 1 %
EOS (ABSOLUTE): 0.2 10*3/uL (ref 0.0–0.4)
Eos: 3 %
Hematocrit: 38.3 % (ref 34.0–46.6)
Hemoglobin: 11.9 g/dL (ref 11.1–15.9)
Immature Grans (Abs): 0 10*3/uL (ref 0.0–0.1)
Immature Granulocytes: 0 %
Lymphocytes Absolute: 1.7 10*3/uL (ref 0.7–3.1)
Lymphs: 19 %
MCH: 25.5 pg — ABNORMAL LOW (ref 26.6–33.0)
MCHC: 31.1 g/dL — ABNORMAL LOW (ref 31.5–35.7)
MCV: 82 fL (ref 79–97)
Monocytes Absolute: 0.6 10*3/uL (ref 0.1–0.9)
Monocytes: 7 %
Neutrophils Absolute: 6.5 10*3/uL (ref 1.4–7.0)
Neutrophils: 70 %
Platelets: 377 10*3/uL (ref 150–450)
RBC: 4.67 x10E6/uL (ref 3.77–5.28)
RDW: 13.3 % (ref 11.7–15.4)
WBC: 9.1 10*3/uL (ref 3.4–10.8)

## 2023-04-02 LAB — COMPREHENSIVE METABOLIC PANEL
ALT: 8 [IU]/L (ref 0–32)
AST: 10 [IU]/L (ref 0–40)
Albumin: 4.1 g/dL (ref 4.0–5.0)
Alkaline Phosphatase: 88 [IU]/L (ref 44–121)
BUN/Creatinine Ratio: 10 (ref 9–23)
BUN: 6 mg/dL (ref 6–20)
Bilirubin Total: <0.2 mg/dL (ref 0.0–1.2)
CO2: 20 mmol/L (ref 20–29)
Calcium: 9.3 mg/dL (ref 8.7–10.2)
Chloride: 103 mmol/L (ref 96–106)
Creatinine, Ser: 0.61 mg/dL (ref 0.57–1.00)
Globulin, Total: 2.9 g/dL (ref 1.5–4.5)
Glucose: 84 mg/dL (ref 70–99)
Potassium: 4.4 mmol/L (ref 3.5–5.2)
Sodium: 137 mmol/L (ref 134–144)
Total Protein: 7 g/dL (ref 6.0–8.5)
eGFR: 130 mL/min/{1.73_m2} (ref >59)

## 2023-04-02 LAB — LIPASE: Lipase: 18 U/L (ref 14–72)

## 2023-04-02 LAB — CELIAC DISEASE AB SCREEN W/RFX
Antigliadin Abs, IgA: 5 U (ref 0–19)
IgA/Immunoglobulin A, Serum: 172 mg/dL (ref 87–352)
Transglutaminase IgA: <2 U/mL (ref 0–3)

## 2023-04-06 ENCOUNTER — Encounter: Payer: Self-pay | Admitting: Obstetrics and Gynecology

## 2023-04-06 ENCOUNTER — Ambulatory Visit: Payer: Medicaid Other | Admitting: Obstetrics and Gynecology

## 2023-04-06 VITALS — BP 154/85 | HR 98 | Wt 278.0 lb

## 2023-04-06 DIAGNOSIS — O26811 Pregnancy related exhaustion and fatigue, first trimester: Secondary | ICD-10-CM

## 2023-04-06 DIAGNOSIS — Z3A08 8 weeks gestation of pregnancy: Secondary | ICD-10-CM

## 2023-04-06 DIAGNOSIS — Z3A09 9 weeks gestation of pregnancy: Secondary | ICD-10-CM

## 2023-04-06 DIAGNOSIS — Z3401 Encounter for supervision of normal first pregnancy, first trimester: Secondary | ICD-10-CM
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
## 2023-04-06 NOTE — Progress Notes (Signed)
Patient presents today due to some concerns over the last week. She states she has been feeling jittery, exhausted, having an increased thirst and feeling heart flutters. She is nervous because she did not have these issues in her last pregnancy. She states she has not taken her BP medication for the last two days, she believed the medication was lowering it too much.

## 2023-04-13 ENCOUNTER — Telehealth: Payer: Self-pay

## 2023-04-13 MED ORDER — SUCRALFATE 1 G PO TABS: 1.0000 g | ORAL_TABLET | Freq: Three times a day (TID) | ORAL | 2 refills | Status: DC

## 2023-04-13 NOTE — Telephone Encounter (Signed)
Patient verbalized understanding she said she thought that it was said she did not have a stomach ulcer. Informed her we would have to do a EGD to confirm if she had a ulcer or not and we can not do that till after pregnancy. Informed her I would call in the medication but before she takes it she needs to okay taking it by her OBGYN she stated she would. Also informed her after delivery if she is still having symptoms to call us and we can schedule a follow up appointment for her

## 2023-04-13 NOTE — Telephone Encounter (Signed)
Patient sent a appointment request through mychart to see you as soon as possible in the comments it says. I discontinued use of the medication for the ulcer because you said that you didn't believe i had one and now i'm having pain and tenderness in my stomach. all over but mostly on the left side . Called patient she discontinue the Sucralfate and omeprazole. She states the pain is worse if she press on it and if her stomach is full. She states the pain is a aching pain. She states she might be a little constipated she is having bowel movements every day 2 to 3 times a day. States they are hard and soft. Denies any blood in stool. She wants to know what you recommend next and if you want me to schedule her next available or work her in somewhere?

## 2023-04-30 ENCOUNTER — Other Ambulatory Visit (HOSPITAL_COMMUNITY)
Admission: RE | Admit: 2023-04-30 | Discharge: 2023-04-30 | Disposition: A | Discharge status: Home / Self Care | Payer: Medicaid Other | Source: Ambulatory Visit | Attending: Obstetrics | Admitting: Obstetrics

## 2023-04-30 ENCOUNTER — Encounter: Payer: Self-pay | Admitting: Obstetrics

## 2023-04-30 ENCOUNTER — Ambulatory Visit (INDEPENDENT_AMBULATORY_CARE_PROVIDER_SITE_OTHER): Payer: Medicaid Other | Admitting: Obstetrics

## 2023-04-30 VITALS — BP 135/83 | HR 97 | Wt 277.0 lb

## 2023-04-30 DIAGNOSIS — Z0184 Encounter for antibody response examination: Secondary | ICD-10-CM

## 2023-04-30 DIAGNOSIS — Z98891 History of uterine scar from previous surgery: Secondary | ICD-10-CM

## 2023-04-30 DIAGNOSIS — Z131 Encounter for screening for diabetes mellitus: Secondary | ICD-10-CM | POA: Insufficient documentation

## 2023-04-30 DIAGNOSIS — Z1379 Encounter for other screening for genetic and chromosomal anomalies: Secondary | ICD-10-CM | POA: Insufficient documentation

## 2023-04-30 DIAGNOSIS — Z13220 Encounter for screening for lipoid disorders: Secondary | ICD-10-CM | POA: Insufficient documentation

## 2023-04-30 DIAGNOSIS — Z113 Encounter for screening for infections with a predominantly sexual mode of transmission: Secondary | ICD-10-CM | POA: Diagnosis present

## 2023-04-30 DIAGNOSIS — O162 Unspecified maternal hypertension, second trimester: Secondary | ICD-10-CM | POA: Diagnosis present

## 2023-04-30 DIAGNOSIS — O10011 Pre-existing essential hypertension complicating pregnancy, first trimester: Secondary | ICD-10-CM

## 2023-04-30 DIAGNOSIS — D649 Anemia, unspecified: Secondary | ICD-10-CM | POA: Insufficient documentation

## 2023-04-30 DIAGNOSIS — O26899 Other specified pregnancy related conditions, unspecified trimester: Secondary | ICD-10-CM

## 2023-04-30 DIAGNOSIS — Z0283 Encounter for blood-alcohol and blood-drug test: Secondary | ICD-10-CM

## 2023-04-30 DIAGNOSIS — Z3A12 12 weeks gestation of pregnancy: Secondary | ICD-10-CM

## 2023-04-30 DIAGNOSIS — Z6841 Body Mass Index (BMI) 40.0 and over, adult: Secondary | ICD-10-CM | POA: Insufficient documentation

## 2023-04-30 MED ORDER — ASPIRIN 81 MG PO TBEC: 162.0000 mg | DELAYED_RELEASE_TABLET | Freq: Every day | ORAL | 12 refills | Status: DC

## 2023-04-30 MED ORDER — LABETALOL HCL 100 MG PO TABS: 100.0000 mg | ORAL_TABLET | Freq: Two times a day (BID) | ORAL | 6 refills | Status: DC
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
## 2023-05-01 LAB — COMPREHENSIVE METABOLIC PANEL
ALT: 7 [IU]/L (ref 0–32)
AST: 12 [IU]/L (ref 0–40)
Albumin: 4 g/dL (ref 4.0–5.0)
Alkaline Phosphatase: 77 [IU]/L (ref 44–121)
BUN/Creatinine Ratio: 11 (ref 9–23)
BUN: 6 mg/dL (ref 6–20)
Bilirubin Total: <0.2 mg/dL (ref 0.0–1.2)
CO2: 18 mmol/L — ABNORMAL LOW (ref 20–29)
Calcium: 9.2 mg/dL (ref 8.7–10.2)
Chloride: 102 mmol/L (ref 96–106)
Creatinine, Ser: 0.54 mg/dL — ABNORMAL LOW (ref 0.57–1.00)
Globulin, Total: 2.8 g/dL (ref 1.5–4.5)
Glucose: 81 mg/dL (ref 70–99)
Potassium: 4.3 mmol/L (ref 3.5–5.2)
Sodium: 137 mmol/L (ref 134–144)
Total Protein: 6.8 g/dL (ref 6.0–8.5)
eGFR: 134 mL/min/{1.73_m2} (ref >59)

## 2023-05-01 LAB — CBC/D/PLT+RPR+RH+ABO+RUBIGG...
Antibody Screen: NEGATIVE
Basophils Absolute: 0 10*3/uL (ref 0.0–0.2)
Basos: 0 %
EOS (ABSOLUTE): 0.2 10*3/uL (ref 0.0–0.4)
Eos: 2 %
HCV Ab: NONREACTIVE
HIV Screen 4th Generation wRfx: NONREACTIVE
Hematocrit: 39.4 % (ref 34.0–46.6)
Hemoglobin: 12.4 g/dL (ref 11.1–15.9)
Hepatitis B Surface Ag: NEGATIVE
Immature Grans (Abs): 0 10*3/uL (ref 0.0–0.1)
Immature Granulocytes: 0 %
Lymphocytes Absolute: 1.7 10*3/uL (ref 0.7–3.1)
Lymphs: 16 %
MCH: 26.4 pg — ABNORMAL LOW (ref 26.6–33.0)
MCHC: 31.5 g/dL (ref 31.5–35.7)
MCV: 84 fL (ref 79–97)
Monocytes Absolute: 0.5 10*3/uL (ref 0.1–0.9)
Monocytes: 5 %
Neutrophils Absolute: 7.9 10*3/uL — ABNORMAL HIGH (ref 1.4–7.0)
Neutrophils: 77 %
Platelets: 324 10*3/uL (ref 150–450)
RBC: 4.7 x10E6/uL (ref 3.77–5.28)
RDW: 13.4 % (ref 11.7–15.4)
RPR Ser Ql: NONREACTIVE
Rh Factor: POSITIVE
Rubella Antibodies, IGG: 5.07 {index} (ref >0.99)
Varicella zoster IgG: REACTIVE
WBC: 10.3 10*3/uL (ref 3.4–10.8)

## 2023-05-01 LAB — URINALYSIS, ROUTINE W REFLEX MICROSCOPIC
Bilirubin, UA: NEGATIVE
Glucose, UA: NEGATIVE
Ketones, UA: NEGATIVE
Leukocytes,UA: NEGATIVE
Nitrite, UA: NEGATIVE
Protein,UA: NEGATIVE
RBC, UA: NEGATIVE
Specific Gravity, UA: 1.016 (ref 1.005–1.030)
Urobilinogen, Ur: 0.2 mg/dL (ref 0.2–1.0)
pH, UA: 7 (ref 5.0–7.5)

## 2023-05-01 LAB — HEMOGLOBIN A1C
Est. average glucose Bld gHb Est-mCnc: 103 mg/dL
Hgb A1c MFr Bld: 5.2 % (ref 4.8–5.6)

## 2023-05-01 LAB — TSH+FREE T4
Free T4: 1.29 ng/dL (ref 0.82–1.77)
TSH: 1.72 u[IU]/mL (ref 0.450–4.500)

## 2023-05-01 LAB — PROTEIN / CREATININE RATIO, URINE
Creatinine, Urine: 81.7 mg/dL
Protein, Ur: 10 mg/dL
Protein/Creat Ratio: 122 mg/g{creat} (ref 0–200)

## 2023-05-01 LAB — HCV INTERPRETATION

## 2023-05-03 ENCOUNTER — Encounter: Payer: Self-pay | Admitting: Obstetrics

## 2023-05-04 LAB — CERVICOVAGINAL ANCILLARY ONLY
Chlamydia: NEGATIVE
Comment: NEGATIVE
Comment: NORMAL
Neisseria Gonorrhea: NEGATIVE

## 2023-05-04 LAB — CYTOLOGY - PAP: Diagnosis: NEGATIVE

## 2023-05-06 LAB — MATERNIT 21 PLUS CORE, BLOOD
Fetal Fraction: 12
Result (T21): NEGATIVE
Trisomy 13 (Patau syndrome): NEGATIVE
Trisomy 18 (Edwards syndrome): NEGATIVE
Trisomy 21 (Down syndrome): NEGATIVE

## 2023-05-07 ENCOUNTER — Encounter: Payer: Self-pay | Admitting: Obstetrics

## 2023-05-07 ENCOUNTER — Ambulatory Visit (INDEPENDENT_AMBULATORY_CARE_PROVIDER_SITE_OTHER): Payer: Medicaid Other

## 2023-05-07 VITALS — BP 130/72 | HR 89 | Ht 64.0 in | Wt 277.0 lb

## 2023-05-07 DIAGNOSIS — Z0130 Encounter for examination of blood pressure without abnormal findings: Secondary | ICD-10-CM

## 2023-05-07 NOTE — Progress Notes (Signed)
    NURSE VISIT NOTE  Subjective:    Patient ID: Joyce Smith, female    DOB: 2000-08-15, 22 y.o.   MRN: [id number]  HPI  Patient is a 22 y.o. G47P1011 female who presents for BP check per order from Guadlupe Spanish, CNM.   Patient reports compliance with prescribed BP medications: yes Labetalol 100 mgtwice daily Last dose of BP medication:  first dose at 2:00 today.  BP Readings from Last 3 Encounters:  04/30/23 135/83  04/06/23 (!) 154/85  03/30/23 138/84   Pulse Readings from Last 3 Encounters:  04/30/23 97  04/06/23 98  03/30/23 76    Objective:    LMP 01/30/2023 (Exact Date)   Assessment:   1. BP check      Plan:   Per Dr. Guadlupe Spanish, CNM:  Continue current treatment regimen.  Patient verbalized understanding of instructions.   Cornelius Moras, CMA

## 2023-05-16 ENCOUNTER — Other Ambulatory Visit: Payer: Self-pay

## 2023-05-16 DIAGNOSIS — O99891 Other specified diseases and conditions complicating pregnancy: Secondary | ICD-10-CM | POA: Insufficient documentation

## 2023-05-16 DIAGNOSIS — R202 Paresthesia of skin: Secondary | ICD-10-CM | POA: Insufficient documentation

## 2023-05-16 DIAGNOSIS — Z3A15 15 weeks gestation of pregnancy: Secondary | ICD-10-CM | POA: Diagnosis not present

## 2023-05-16 DIAGNOSIS — O132 Gestational [pregnancy-induced] hypertension without significant proteinuria, second trimester: Secondary | ICD-10-CM | POA: Insufficient documentation

## 2023-05-16 DIAGNOSIS — Z79899 Other long term (current) drug therapy: Secondary | ICD-10-CM | POA: Diagnosis not present

## 2023-05-16 LAB — BASIC METABOLIC PANEL
Anion gap: 13 (ref 5–15)
BUN: 9 mg/dL (ref 6–20)
CO2: 21 mmol/L — ABNORMAL LOW (ref 22–32)
Calcium: 9.1 mg/dL (ref 8.9–10.3)
Chloride: 101 mmol/L (ref 98–111)
Creatinine, Ser: 0.49 mg/dL (ref 0.44–1.00)
GFR, Estimated: >60 mL/min (ref >60)
Glucose, Bld: 85 mg/dL (ref 70–99)
Potassium: 4 mmol/L (ref 3.5–5.1)
Sodium: 135 mmol/L (ref 135–145)

## 2023-05-16 LAB — CBC
HCT: 35.3 % — ABNORMAL LOW (ref 36.0–46.0)
Hemoglobin: 11.4 g/dL — ABNORMAL LOW (ref 12.0–15.0)
MCH: 26.7 pg (ref 26.0–34.0)
MCHC: 32.3 g/dL (ref 30.0–36.0)
MCV: 82.7 fL (ref 80.0–100.0)
Platelets: 285 10*3/uL (ref 150–400)
RBC: 4.27 MIL/uL (ref 3.87–5.11)
RDW: 13.4 % (ref 11.5–15.5)
WBC: 9.6 10*3/uL (ref 4.0–10.5)
nRBC: 0 % (ref 0.0–0.2)

## 2023-05-16 NOTE — ED Triage Notes (Signed)
 Pt arrives via POV with CC of R sided facial numbness that started at approx 1900. Pt is approx [redacted] weeks pregnant. Pt reports less sensation in R cheek but sensation intact in rest of body.

## 2023-05-17 ENCOUNTER — Emergency Department
Admission: EM | Admit: 2023-05-17 | Discharge: 2023-05-17 | Disposition: A | Discharge status: Home / Self Care | Payer: Medicaid Other | Attending: Emergency Medicine | Admitting: Emergency Medicine

## 2023-05-17 DIAGNOSIS — R202 Paresthesia of skin: Secondary | ICD-10-CM

## 2023-05-17 DIAGNOSIS — O132 Gestational [pregnancy-induced] hypertension without significant proteinuria, second trimester: Secondary | ICD-10-CM

## 2023-05-17 NOTE — ED Provider Notes (Signed)
 [facility] Provider Note    Event Date/Time   First MD Initiated Contact with Patient 05/17/23 0138     (approximate)   History   Facial Numbness   HPI  Joyce Smith is a 23 y.o. female   Past medical history of approximately [redacted] weeks pregnant who presents to the emergency department with transient right-sided facial numbness.  Happened this evening, has resolved now.  No other focal neurologic deficits noted by patient.  No headache or visual changes.  She has had history of transient numbness to different parts of her body including her upper and lower extremities in the past that transiently resolved as well.  She denies any abdominal pain, vaginal bleeding.  She has a history of hypertension for which she is on labetalol  and is followed by her gynecologist who is aware of this pre-existing hypertension as well.   Independent Historian contributed to assessment above: Her partner is at bedside to corroborate information past medical history as above    Physical Exam   Triage Vital Signs: ED Triage Vitals  Encounter Vitals Group     BP 05/16/23 1943 (!) 149/83     Systolic BP Percentile --      Diastolic BP Percentile --      Pulse Rate 05/16/23 1943 94     Resp 05/16/23 1943 18     Temp 05/16/23 1943 98.1 F (36.7 C)     Temp Source 05/16/23 1943 Oral     SpO2 05/16/23 1943 98 %     Weight 05/16/23 1946 276 lb (125.2 kg)     Height 05/16/23 1946 5' 4 (1.626 m)     Head Circumference --      Peak Flow --      Pain Score 05/16/23 1946 5     Pain Loc --      Pain Education --      Exclude from Growth Chart --     Most recent vital signs: Vitals:   05/16/23 1943 05/17/23 0146  BP: (!) 149/83 (!) 148/69  Pulse: 94 74  Resp: 18 15  Temp: 98.1 F (36.7 C) 98.6 F (37 C)  SpO2: 98% 100%    General: Awake, no distress.  CV:  Good peripheral perfusion.  Resp:  Normal effort.  Abd:  No distention.  Other:  Mildly  hypertensive.  Otherwise vital signs are normal.  No focal neurologic deficits including motor or sensory deficits, finger-nose, gait is normal.   ED Results / Procedures / Treatments   Labs (all labs ordered are listed, but only abnormal results are displayed) Labs Reviewed  CBC - Abnormal; Notable for the following components:      Result Value   Hemoglobin 11.4 (*)    HCT 35.3 (*)    All other components within normal limits  BASIC METABOLIC PANEL - Abnormal; Notable for the following components:   CO2 21 (*)    All other components within normal limits     I ordered and reviewed the above labs they are notable for cell counts electrolytes largely unremarkable  EKG  ED ECG REPORT I, Ginnie Shams, the attending physician, personally viewed and interpreted this ECG.   Date: 05/17/2023  EKG Time: 1951  Rate: 103  Rhythm: sinus tachycardia  Axis: nl  Intervals:none  ST&T Change: no stemi   PROCEDURES:  Critical Care performed: No  Procedures   MEDICATIONS ORDERED IN ED: Medications - No data to display  IMPRESSION /  MDM / ASSESSMENT AND PLAN / ED COURSE  I reviewed the triage vital signs and the nursing notes.                                Patient's presentation is most consistent with acute presentation with potential threat to life or bodily function.  Differential diagnosis includes, but is not limited to, CVA/TIA, MS, peripheral neuropathy, Bell's palsy, electrolyte derangements   MDM:   Resolved right-sided numbness to the face.  No other acute neurologic deficits to suggest stroke.  No evidence of motor deficits to suggest Bell's palsy.  I am not sure what is causing her symptoms but I doubt stroke or TIA at this time.  I offered MRI of the brain but low clinical suspicion for neurologic emergencies, patient defers and would rather outpatient follow-up.  She has mild hypertension for which she is already on medications and is followed by gynecology for  gestational hypertension, we will continue her medications and recheck her blood pressure at home follow-up with gynecology for this issue.  Will have her follow-up with neurologist as well given her history of transient numbness to other parts of her body.      FINAL CLINICAL IMPRESSION(S) / ED DIAGNOSES   Final diagnoses:  Paresthesia  Gestational hypertension, second trimester     Rx / DC Orders   ED Discharge Orders     None        Note:  This document was prepared using Dragon voice recognition software and may include unintentional dictation errors.    Cyrena Mylar, MD 05/17/23 [phone]

## 2023-05-17 NOTE — Discharge Instructions (Signed)
 Thank you for choosing [place] for your health care today!  Please see your primary doctor this week for a follow up appointment.   If you have any new, worsening, or unexpected symptoms call your doctor right away or come back to the emergency department for reevaluation.  It was my pleasure to care for you today.   Daneil Dan Modesto Charon, MD

## 2023-05-28 ENCOUNTER — Encounter: Payer: Self-pay | Admitting: Obstetrics and Gynecology

## 2023-05-28 ENCOUNTER — Ambulatory Visit (INDEPENDENT_AMBULATORY_CARE_PROVIDER_SITE_OTHER): Payer: Medicaid Other | Admitting: Obstetrics and Gynecology

## 2023-05-28 VITALS — BP 137/89 | HR 88 | Wt 286.0 lb

## 2023-05-28 DIAGNOSIS — Z98891 History of uterine scar from previous surgery: Secondary | ICD-10-CM

## 2023-05-28 DIAGNOSIS — Z1379 Encounter for other screening for genetic and chromosomal anomalies: Secondary | ICD-10-CM

## 2023-05-28 DIAGNOSIS — O0992 Supervision of high risk pregnancy, unspecified, second trimester: Secondary | ICD-10-CM

## 2023-05-28 DIAGNOSIS — O99212 Obesity complicating pregnancy, second trimester: Secondary | ICD-10-CM

## 2023-05-28 DIAGNOSIS — E669 Obesity, unspecified: Secondary | ICD-10-CM

## 2023-05-28 DIAGNOSIS — O10912 Unspecified pre-existing hypertension complicating pregnancy, second trimester: Secondary | ICD-10-CM

## 2023-05-28 DIAGNOSIS — Z3689 Encounter for other specified antenatal screening: Secondary | ICD-10-CM

## 2023-05-28 DIAGNOSIS — Z3A16 16 weeks gestation of pregnancy: Secondary | ICD-10-CM

## 2023-05-28 DIAGNOSIS — O99210 Obesity complicating pregnancy, unspecified trimester: Secondary | ICD-10-CM

## 2023-05-28 LAB — POCT URINALYSIS DIPSTICK OB
Bilirubin, UA: NEGATIVE
Blood, UA: NEGATIVE
Glucose, UA: NEGATIVE
Ketones, UA: NEGATIVE
Leukocytes, UA: NEGATIVE
Nitrite, UA: NEGATIVE
POC,PROTEIN,UA: NEGATIVE
Spec Grav, UA: 1.02 (ref 1.010–1.025)
Urobilinogen, UA: 0.2 U/dL
pH, UA: 6 (ref 5.0–8.0)

## 2023-05-28 NOTE — Addendum Note (Signed)
Addended by: Kathlene Cote on: 05/28/2023 11:43 AM   Modules accepted: Orders

## 2023-05-28 NOTE — Progress Notes (Signed)
ROB: Patient is a 23 y.o. G3P1011 at [redacted]w[redacted]d who presents for routine OB care.  Pregnancy is complicated by: Allergic rhinitis; Asthma; Depression dx'd 2015; Hypertension; Supervision of high risk pregnancy, antepartum; BMI 45.0-49.9, adult (HCC); and History of cesarean delivery.  Patient has complaints of fatigue. Does feel like she rests at night but just doesn't feel like it's enough.  Asks about supplements in pregnancy.  Discussed safe remedies.  Reviewed labs, had nml [id number], for AFP today. Baseline PIH labs also ordered for h/o CHTN in pregnancy (currently on Labetalol). For anatomy scan next visit with MFM due to BMI, order placed. Also needs Anesthesia consult, ordered. RTC in 4 weeks.

## 2023-05-28 NOTE — Addendum Note (Signed)
Addended by: Loney Laurence on: 05/28/2023 11:53 AM   Modules accepted: Orders

## 2023-06-01 ENCOUNTER — Encounter: Payer: Self-pay | Admitting: Obstetrics and Gynecology

## 2023-06-02 ENCOUNTER — Encounter: Payer: Self-pay | Admitting: Obstetrics and Gynecology

## 2023-06-03 ENCOUNTER — Encounter: Payer: Self-pay | Admitting: Obstetrics and Gynecology

## 2023-06-03 LAB — AFP, SERUM, OPEN SPINA BIFIDA
AFP MoM: 0.61
AFP Value: 15.7 ng/mL
Gest. Age on Collection Date: 16.9 wk
Maternal Age At EDD: 22.8 a
OSBR Risk 1 IN: 10000
Test Results:: NEGATIVE
Weight: 286 [lb_av]

## 2023-06-03 LAB — COMPREHENSIVE METABOLIC PANEL
ALT: 6 [IU]/L (ref 0–32)
AST: 10 [IU]/L (ref 0–40)
Albumin: 3.6 g/dL — ABNORMAL LOW (ref 4.0–5.0)
Alkaline Phosphatase: 66 [IU]/L (ref 44–121)
BUN/Creatinine Ratio: 11 (ref 9–23)
BUN: 6 mg/dL (ref 6–20)
Bilirubin Total: <0.2 mg/dL (ref 0.0–1.2)
CO2: 18 mmol/L — ABNORMAL LOW (ref 20–29)
Calcium: 8.8 mg/dL (ref 8.7–10.2)
Chloride: 102 mmol/L (ref 96–106)
Creatinine, Ser: 0.54 mg/dL — ABNORMAL LOW (ref 0.57–1.00)
Globulin, Total: 2.6 g/dL (ref 1.5–4.5)
Glucose: 83 mg/dL (ref 70–99)
Potassium: 4.4 mmol/L (ref 3.5–5.2)
Sodium: 137 mmol/L (ref 134–144)
Total Protein: 6.2 g/dL (ref 6.0–8.5)
eGFR: 134 mL/min/{1.73_m2} (ref >59)

## 2023-06-10 ENCOUNTER — Telehealth: Payer: Self-pay

## 2023-06-10 ENCOUNTER — Encounter: Payer: Self-pay | Admitting: Obstetrics and Gynecology

## 2023-06-10 NOTE — Telephone Encounter (Signed)
Patient advised she just came from the dentist. They advised she needs her wisdom teeth extracted. She is inquiring if this is safe in pregnancy. Advised we have a dental protocol that we can fax if she can provide [place] with the oral surgeon's information. She will call back or send thru my chart when she has this information.

## 2023-06-11 ENCOUNTER — Encounter: Payer: Self-pay | Admitting: Obstetrics

## 2023-06-15 ENCOUNTER — Other Ambulatory Visit: Payer: Medicaid Other

## 2023-06-22 ENCOUNTER — Encounter: Payer: Self-pay | Admitting: Obstetrics

## 2023-06-22 ENCOUNTER — Ambulatory Visit (INDEPENDENT_AMBULATORY_CARE_PROVIDER_SITE_OTHER): Payer: Medicaid Other | Admitting: Obstetrics

## 2023-06-22 VITALS — BP 135/82 | HR 90 | Wt 289.0 lb

## 2023-06-22 DIAGNOSIS — O0992 Supervision of high risk pregnancy, unspecified, second trimester: Secondary | ICD-10-CM

## 2023-06-22 DIAGNOSIS — I159 Secondary hypertension, unspecified: Secondary | ICD-10-CM

## 2023-06-22 DIAGNOSIS — Z3A20 20 weeks gestation of pregnancy: Secondary | ICD-10-CM

## 2023-06-22 DIAGNOSIS — O162 Unspecified maternal hypertension, second trimester: Secondary | ICD-10-CM | POA: Diagnosis not present

## 2023-06-22 DIAGNOSIS — Z98891 History of uterine scar from previous surgery: Secondary | ICD-10-CM

## 2023-06-22 DIAGNOSIS — Z6841 Body Mass Index (BMI) 40.0 and over, adult: Secondary | ICD-10-CM

## 2023-06-22 NOTE — Assessment & Plan Note (Signed)
Feeling tired, unable to get comfortable at night. Will add magnesium nightly.  Reviewed recommendation for early one hour GTT. Will come back this week.  Anatomy US with MFM scheduled for 2/14

## 2023-06-22 NOTE — Progress Notes (Signed)
    Return Prenatal Note   Assessment/Plan   Plan  23 y.o. G3P1011 at [redacted]w[redacted]d presents for follow-up OB visit. Reviewed prenatal record including previous visit note.  Hypertension Blood pressure stable on labetalol. Saw PCP for headaches and vision changes. PIH labs done and were WNL. MRI ordered by PCP to be completed on 3/11  Supervision of high risk pregnancy in second trimester Feeling tired, unable to get comfortable at night. Will add magnesium nightly.  Reviewed recommendation for early one hour GTT. Will come back this week.  Anatomy US with MFM scheduled for 2/14    No orders of the defined types were placed in this encounter.  No follow-ups on file.   Future Appointments  Date Time Provider Department Center  06/25/2023 10:15 AM [facility] NURSE [facility]  06/25/2023 10:30 AM [facility] US6 [facility]  06/29/2023 10:00 AM [facility] LAB [facility] None  07/21/2023  9:35 AM Elison Worrel, Irving Burton, CNM [facility] None    For next visit:  continue with routine prenatal care     Subjective   22 y.o. G3P1011 at [redacted]w[redacted]d presents for this follow-up prenatal visit.  Patient with reports of increased nausea after eating. Able to eat small meals but gets full very quickly. Endorses some nighttime heart burn.  Patient reports: Movement: Present Contractions: Not present  Objective   Flow sheet Vitals: Pulse Rate: 90 BP: 135/82 Fundal Height: 20 cm Fetal Heart Rate (bpm): 140 Total weight gain: 17 lb (7.711 kg)  General Appearance  No acute distress, well appearing, and well nourished Pulmonary   Normal work of breathing Neurologic   Alert and oriented to person, place, and time Psychiatric   Mood and affect within normal limits  Oley Balm, CNM  02/11/252:47 PM

## 2023-06-22 NOTE — Assessment & Plan Note (Signed)
Blood pressure stable on labetalol. Saw PCP for headaches and vision changes. PIH labs done and were WNL. MRI ordered by PCP to be completed on 3/11

## 2023-06-25 ENCOUNTER — Ambulatory Visit: Payer: Medicaid Other

## 2023-06-25 ENCOUNTER — Other Ambulatory Visit: Payer: Self-pay | Admitting: *Deleted

## 2023-06-25 ENCOUNTER — Ambulatory Visit: Payer: Medicaid Other | Attending: Obstetrics and Gynecology

## 2023-06-25 ENCOUNTER — Other Ambulatory Visit: Payer: Self-pay | Admitting: Obstetrics and Gynecology

## 2023-06-25 ENCOUNTER — Other Ambulatory Visit: Payer: Self-pay

## 2023-06-25 ENCOUNTER — Ambulatory Visit (HOSPITAL_BASED_OUTPATIENT_CLINIC_OR_DEPARTMENT_OTHER): Payer: Medicaid Other

## 2023-06-25 DIAGNOSIS — Z3A20 20 weeks gestation of pregnancy: Secondary | ICD-10-CM

## 2023-06-25 DIAGNOSIS — Z3689 Encounter for other specified antenatal screening: Secondary | ICD-10-CM | POA: Insufficient documentation

## 2023-06-25 DIAGNOSIS — O10912 Unspecified pre-existing hypertension complicating pregnancy, second trimester: Secondary | ICD-10-CM

## 2023-06-25 DIAGNOSIS — O10919 Unspecified pre-existing hypertension complicating pregnancy, unspecified trimester: Secondary | ICD-10-CM | POA: Insufficient documentation

## 2023-06-25 DIAGNOSIS — J4520 Mild intermittent asthma, uncomplicated: Secondary | ICD-10-CM

## 2023-06-25 DIAGNOSIS — O99210 Obesity complicating pregnancy, unspecified trimester: Secondary | ICD-10-CM | POA: Insufficient documentation

## 2023-06-25 DIAGNOSIS — O99512 Diseases of the respiratory system complicating pregnancy, second trimester: Secondary | ICD-10-CM

## 2023-06-25 DIAGNOSIS — O99212 Obesity complicating pregnancy, second trimester: Secondary | ICD-10-CM | POA: Diagnosis not present

## 2023-06-25 DIAGNOSIS — O10012 Pre-existing essential hypertension complicating pregnancy, second trimester: Secondary | ICD-10-CM

## 2023-06-25 DIAGNOSIS — J45909 Unspecified asthma, uncomplicated: Secondary | ICD-10-CM | POA: Diagnosis not present

## 2023-06-25 DIAGNOSIS — O0992 Supervision of high risk pregnancy, unspecified, second trimester: Secondary | ICD-10-CM | POA: Diagnosis present

## 2023-06-25 DIAGNOSIS — E669 Obesity, unspecified: Secondary | ICD-10-CM

## 2023-06-25 DIAGNOSIS — Z6841 Body Mass Index (BMI) 40.0 and over, adult: Secondary | ICD-10-CM

## 2023-06-25 NOTE — Progress Notes (Signed)
Patient information  Patient Name: Joyce Smith  Patient MRN:   [id number]  Referring practice: MFM Referring Provider: Salomon Mast  MFM CONSULT  Joyce Smith is a 23 y.o. G3P1011 at [redacted]w[redacted]d here for ultrasound and consultation. Patient Active Problem List   Diagnosis Date Noted   BMI 45.0-49.9, adult (HCC) 04/30/2023   History of cesarean delivery 04/30/2023   Supervision of other normal pregnancy, antepartum 03/26/2023   Supervision of high risk pregnancy in second trimester 02/09/2022   Chronic hypertension in pregnancy 08/27/2021   Asthma 01/30/2019   Depression dx'd 2015 01/30/2019   Joyce Smith is doing well today with no acute concerns.    RE CHTN on labetalol: The patient's blood pressure is well-controlled today.  She has yet to take her labetalol but reports that she takes it at night.  She is also compliant with aspirin.  We discussed the potential complications with chronic hypertension in pregnancy and the need for future growth ultrasounds and antenatal testing to minimize the risk of stillbirth.  Discussed that delivery timing may be altered as well likely around 38 weeks or sooner if indicated.  RE asthma in pregnancy: Discussed that approximately one third of patients with asthma will worsen.  Discussed the importance of a peak flow meter as well as monitoring her symptoms and taking her inhaler as prescribed.  Currently she is doing well without complaint.  RE prior C-section: Patient desires repeat C-section.   Counseling I discussed the potential adverse obstetric, fetal and neonatal associations with each specific pregnancy complication. I discussed the risk of fetal growth abnormalities and stillbirth based on her pregnancy risk factors. I discussed the role of antenatal testing, serial growth ultrasounds and the timing of delivery based on the clinical course. I discussed the risk and impact of preeclampsia on her pregnancy and the role of  baseline laboratory assessment of kidney, liver and platelet count as well as the role of low dose Aspirin to the reduce the risk of developing preeclampsia. I reassured the patient that we expect a favorable pregnancy outcome but due to her pregnancy complications she will need a higher level of monitoring for her pregnancy compared to a pregnancy without complications. The patient had time to ask questions that were answered to her satisfaction. She verbalized understanding of our discussion and request to proceed with the plan outlined in the recommendations.   Sonographic findings Single intrauterine pregnancy at 20w 6d  Fetal cardiac activity:  Observed and appears normal. Presentation: Breech. The anatomic structures that were well seen appear normal without evidence of soft markers. Due to poor acoustic windows some structures remain suboptimally visualized. Fetal biometry shows the estimated fetal weight at the 28 percentile.  Amniotic fluid: Within normal limits.  MVP: 4.86 cm. Placenta: Posterior. Adnexa: No abnormality visualized. Cervical length: 4.6 cm.  There are limitations of prenatal ultrasound such as the inability to detect certain abnormalities due to poor visualization. Various factors such as fetal position, gestational age and maternal body habitus may increase the difficulty in visualizing the fetal anatomy.     Recommendations -EDD is Estimated Date of Delivery: 11/06/23 based on LMP. -Detailed ultrasound was done today without abnormalities but much of the anatomy remains suboptimally visualized due to poor acoustic windows. -Baseline preeclampsia labs: CMP, CBC and urine protein/creatinine ratio previously completed.  -Early glucose screening due to multiple risk factors. -Continue Aspirin 81 mg for preeclampsia prophylaxis -Continue labetalol as prescribed, blood pressure goal of less than 140/90 -Baseline  EKG if not previously done within a year.  If there are signs  of left ventricular hypertrophy then an echo should be done. -Continue Symbicort.  Consider peak flow meter for baseline pulmonary assessment. -Follow-up anatomy and fetal growth in 4 to 6 weeks -Serial growth ultrasounds starting around 28 weeks to monitor for fetal growth restriction -Antenatal testing to start around 32 weeks due to the increased risk of stillbirth and high risk pregnancy -Delivery timing pending clinical course but likely around [redacted] weeks gestation -Continue routine prenatal care with referring OB provider  Review of Systems: A review of systems was performed and was negative except per HPI   Vitals and Physical Exam    06/25/2023   11:28 AM 06/22/2023    2:00 PM 05/28/2023    9:52 AM  Vitals with BMI  Weight  289 lbs 286 lbs  BMI   49.07  Systolic 133 135 161  Diastolic 65 82 89  Pulse 81 90 88    Sitting comfortably on the sonogram table Nonlabored breathing Normal rate and rhythm Abdomen is nontender  Past pregnancies OB History  Gravida Para Term Preterm AB Living  3 1 1  0 1 1  SAB IAB Ectopic Multiple Live Births  1 0 0 0 1    # Outcome Date GA Lbr Len/2nd Weight Sex Type Anes PTL Lv  3 Current           2 Term 04/23/22 [redacted]w[redacted]d  6 lb 11.6 oz (3.05 kg) F CS-LTranv Spinal  LIV  1 SAB 06/20/21 [redacted]w[redacted]d   U         I spent 45 minutes reviewing the patients chart, including labs and images as well as counseling the patient about her medical conditions. Greater than 50% of the time was spent in direct face-to-face patient counseling.  [name]  MFM, [facility]   06/25/2023  11:41 AM

## 2023-06-29 ENCOUNTER — Other Ambulatory Visit: Payer: Self-pay

## 2023-06-29 ENCOUNTER — Encounter: Payer: Self-pay | Admitting: Obstetrics and Gynecology

## 2023-06-29 ENCOUNTER — Other Ambulatory Visit: Payer: Medicaid Other

## 2023-06-29 DIAGNOSIS — Z6841 Body Mass Index (BMI) 40.0 and over, adult: Secondary | ICD-10-CM

## 2023-06-29 DIAGNOSIS — Z131 Encounter for screening for diabetes mellitus: Secondary | ICD-10-CM

## 2023-06-29 NOTE — Telephone Encounter (Signed)
Pt called triage and has been taken care of.

## 2023-06-30 LAB — GLUCOSE, 1 HOUR GESTATIONAL: Gestational Diabetes Screen: 112 mg/dL (ref 70–139)

## 2023-07-12 ENCOUNTER — Other Ambulatory Visit: Payer: Self-pay | Admitting: Obstetrics and Gynecology

## 2023-07-12 DIAGNOSIS — I10 Essential (primary) hypertension: Secondary | ICD-10-CM

## 2023-07-15 ENCOUNTER — Encounter: Payer: Self-pay | Admitting: Obstetrics and Gynecology

## 2023-07-21 ENCOUNTER — Telehealth: Payer: Self-pay | Admitting: Certified Nurse Midwife

## 2023-07-21 ENCOUNTER — Encounter: Payer: Medicaid Other | Admitting: Certified Nurse Midwife

## 2023-07-21 NOTE — Assessment & Plan Note (Deleted)
 MFM referral completed serial growth at 28 weeks -antenatal surveillance beginning at 32 weeks -Baseline preeclampsia labs- completed -Delivery likely by 38 weeks per MFM -Delivery likely by 38 weeks per MFM

## 2023-07-21 NOTE — Telephone Encounter (Signed)
 Reached out to pt to reschedule ROB appt that was scheduled on 07/21/2023 at 9:35 with E. Slaughterbeck.  Left message for pt to call back.

## 2023-07-22 NOTE — Telephone Encounter (Signed)
 Pt is scheduled on 07/28/2023 at 9:35 with S. Free.

## 2023-07-26 ENCOUNTER — Ambulatory Visit: Payer: Medicaid Other

## 2023-07-26 ENCOUNTER — Ambulatory Visit

## 2023-07-26 ENCOUNTER — Ambulatory Visit: Payer: Medicaid Other | Attending: Maternal & Fetal Medicine

## 2023-07-26 ENCOUNTER — Other Ambulatory Visit: Payer: Self-pay

## 2023-07-26 VITALS — BP 143/82

## 2023-07-26 DIAGNOSIS — Z3480 Encounter for supervision of other normal pregnancy, unspecified trimester: Secondary | ICD-10-CM

## 2023-07-26 DIAGNOSIS — J45909 Unspecified asthma, uncomplicated: Secondary | ICD-10-CM | POA: Insufficient documentation

## 2023-07-26 DIAGNOSIS — O99512 Diseases of the respiratory system complicating pregnancy, second trimester: Secondary | ICD-10-CM | POA: Diagnosis not present

## 2023-07-26 DIAGNOSIS — Z363 Encounter for antenatal screening for malformations: Secondary | ICD-10-CM | POA: Insufficient documentation

## 2023-07-26 DIAGNOSIS — E669 Obesity, unspecified: Secondary | ICD-10-CM

## 2023-07-26 DIAGNOSIS — O10912 Unspecified pre-existing hypertension complicating pregnancy, second trimester: Secondary | ICD-10-CM | POA: Diagnosis not present

## 2023-07-26 DIAGNOSIS — Z3A25 25 weeks gestation of pregnancy: Secondary | ICD-10-CM | POA: Insufficient documentation

## 2023-07-26 DIAGNOSIS — O99212 Obesity complicating pregnancy, second trimester: Secondary | ICD-10-CM | POA: Diagnosis present

## 2023-07-26 DIAGNOSIS — O10919 Unspecified pre-existing hypertension complicating pregnancy, unspecified trimester: Secondary | ICD-10-CM | POA: Diagnosis present

## 2023-07-26 DIAGNOSIS — O10012 Pre-existing essential hypertension complicating pregnancy, second trimester: Secondary | ICD-10-CM | POA: Diagnosis not present

## 2023-07-26 DIAGNOSIS — O99210 Obesity complicating pregnancy, unspecified trimester: Secondary | ICD-10-CM

## 2023-07-27 ENCOUNTER — Other Ambulatory Visit: Payer: Self-pay | Admitting: *Deleted

## 2023-07-27 DIAGNOSIS — O99212 Obesity complicating pregnancy, second trimester: Secondary | ICD-10-CM

## 2023-07-27 DIAGNOSIS — O10912 Unspecified pre-existing hypertension complicating pregnancy, second trimester: Secondary | ICD-10-CM

## 2023-07-28 ENCOUNTER — Ambulatory Visit

## 2023-07-28 ENCOUNTER — Other Ambulatory Visit (HOSPITAL_COMMUNITY)
Admission: RE | Admit: 2023-07-28 | Discharge: 2023-07-28 | Disposition: A | Discharge status: Home / Self Care | Source: Ambulatory Visit

## 2023-07-28 VITALS — BP 133/88 | HR 88 | Wt 295.0 lb

## 2023-07-28 DIAGNOSIS — O10012 Pre-existing essential hypertension complicating pregnancy, second trimester: Secondary | ICD-10-CM | POA: Diagnosis not present

## 2023-07-28 DIAGNOSIS — O34219 Maternal care for unspecified type scar from previous cesarean delivery: Secondary | ICD-10-CM

## 2023-07-28 DIAGNOSIS — Z3480 Encounter for supervision of other normal pregnancy, unspecified trimester: Secondary | ICD-10-CM

## 2023-07-28 DIAGNOSIS — R300 Dysuria: Secondary | ICD-10-CM | POA: Diagnosis present

## 2023-07-28 DIAGNOSIS — Z6841 Body Mass Index (BMI) 40.0 and over, adult: Secondary | ICD-10-CM

## 2023-07-28 DIAGNOSIS — Z98891 History of uterine scar from previous surgery: Secondary | ICD-10-CM

## 2023-07-28 DIAGNOSIS — N898 Other specified noninflammatory disorders of vagina: Secondary | ICD-10-CM

## 2023-07-28 DIAGNOSIS — D649 Anemia, unspecified: Secondary | ICD-10-CM | POA: Insufficient documentation

## 2023-07-28 DIAGNOSIS — Z131 Encounter for screening for diabetes mellitus: Secondary | ICD-10-CM

## 2023-07-28 DIAGNOSIS — O0992 Supervision of high risk pregnancy, unspecified, second trimester: Secondary | ICD-10-CM

## 2023-07-28 DIAGNOSIS — Z3A25 25 weeks gestation of pregnancy: Secondary | ICD-10-CM

## 2023-07-28 DIAGNOSIS — O10919 Unspecified pre-existing hypertension complicating pregnancy, unspecified trimester: Secondary | ICD-10-CM

## 2023-07-28 DIAGNOSIS — Z113 Encounter for screening for infections with a predominantly sexual mode of transmission: Secondary | ICD-10-CM

## 2023-07-28 NOTE — Assessment & Plan Note (Signed)
-   Reviewed need to schedule with MD for next appointment to schedule repeat CD.

## 2023-07-28 NOTE — Assessment & Plan Note (Addendum)
-   Missed original appointment with anesthesia. Provided with number to reschedule.

## 2023-07-28 NOTE — Progress Notes (Signed)
    Return Prenatal Note   Assessment/Plan   Plan  23 y.o. G3P1011 at [redacted]w[redacted]d presents for follow-up OB visit. Reviewed prenatal record including previous visit note.  BMI 45.0-49.9, adult (HCC) - Missed original appointment with anesthesia. Provided with number to reschedule.  History of cesarean delivery - Reviewed need to schedule with MD for next appointment to schedule repeat CD.   Supervision of high risk pregnancy in second trimester - Self swab today due to vaginal irritation. UA unremarkable for UTI.  - Prepared for 1 hour glucola, third trimester labs, and Tdap at next visit.  - Reviewed red flag warning signs anticipatory guidance for upcoming prenatal care.   Chronic hypertension in pregnancy - Normotensive today in clinic. Still taking labetalol but has not taken this am.    Orders Placed This Encounter  Procedures   28 Week RH+Panel    Standing Status:   Future    Expected Date:   08/11/2023    Expiration Date:   07/26/2024   Ambulatory referral to Anesthesiology    Referral Priority:   Routine    Referral Type:   Surgical    Referral Reason:   Specialty Services Required    Requested Specialty:   Anesthesiology    Number of Visits Requested:   1   POC Urinalysis Dipstick OB   Return in about 2 weeks (around 08/11/2023) for ROB with 1 hour glucola.   Future Appointments  Date Time Provider Department Center  08/10/2023  9:00 AM [facility] LAB [facility] None  08/10/2023  9:35 AM Linzie Collin, MD [facility] None  08/19/2023  9:20 AM Thomasene Ripple, DO [facility] None  08/20/2023  9:15 AM [facility] NURSE [facility]  08/20/2023  9:30 AM [facility] US1 [facility]  09/17/2023  9:15 AM [facility] NURSE [facility]  09/17/2023  9:30 AM [facility] US1 [facility]  09/27/2023  8:15 AM [facility] NURSE [facility]  09/27/2023  8:30 AM [facility] US2 [facility]    For next visit:  continue with routine prenatal care     Subjective   22 y.o. G3P1011 at [redacted]w[redacted]d presents for this  follow-up prenatal visit.  Patient has concerns about increased vaginal discharge. Feels very moist and has to change underwear regularly. Also "feels weird and hot" when urinating.  Patient reports: Movement: Present Contractions: Not present  Objective   Flow sheet Vitals: Pulse Rate: 88 BP: 133/88 Fundal Height: 27 cm Fetal Heart Rate (bpm): 140 Total weight gain: 23 lb (10.4 kg)  General Appearance  No acute distress, well appearing, and well nourished Pulmonary   Normal work of breathing Neurologic   Alert and oriented to person, place, and time Psychiatric   Mood and affect within normal limits  Lindalou Hose Faith Branan, CNM  07/27/2508:22 AM

## 2023-07-28 NOTE — Assessment & Plan Note (Addendum)
-   Self swab today due to vaginal irritation. UA unremarkable for UTI.  - Starting using OTC magnesium last night and it helped with her headache at the time.  - Prepared for 1 hour glucola, third trimester labs, and Tdap at next visit.  - Reviewed red flag warning signs anticipatory guidance for upcoming prenatal care.

## 2023-07-28 NOTE — Assessment & Plan Note (Signed)
-   Normotensive today in clinic. Still taking labetalol but has not taken this am.

## 2023-07-29 LAB — CERVICOVAGINAL ANCILLARY ONLY
Bacterial Vaginitis (gardnerella): NEGATIVE
Candida Glabrata: NEGATIVE
Candida Vaginitis: NEGATIVE
Chlamydia: NEGATIVE
Comment: NEGATIVE
Comment: NEGATIVE
Comment: NEGATIVE
Comment: NEGATIVE
Comment: NEGATIVE
Comment: NORMAL
Neisseria Gonorrhea: NEGATIVE
Trichomonas: NEGATIVE

## 2023-08-10 ENCOUNTER — Ambulatory Visit (INDEPENDENT_AMBULATORY_CARE_PROVIDER_SITE_OTHER): Admitting: Obstetrics and Gynecology

## 2023-08-10 ENCOUNTER — Other Ambulatory Visit

## 2023-08-10 ENCOUNTER — Encounter: Payer: Self-pay | Admitting: Obstetrics and Gynecology

## 2023-08-10 VITALS — BP 121/76 | HR 96 | Wt 298.8 lb

## 2023-08-10 DIAGNOSIS — O0992 Supervision of high risk pregnancy, unspecified, second trimester: Secondary | ICD-10-CM | POA: Diagnosis not present

## 2023-08-10 DIAGNOSIS — Z113 Encounter for screening for infections with a predominantly sexual mode of transmission: Secondary | ICD-10-CM

## 2023-08-10 DIAGNOSIS — Z3A27 27 weeks gestation of pregnancy: Secondary | ICD-10-CM | POA: Diagnosis not present

## 2023-08-10 DIAGNOSIS — Z3480 Encounter for supervision of other normal pregnancy, unspecified trimester: Secondary | ICD-10-CM

## 2023-08-10 DIAGNOSIS — Z23 Encounter for immunization: Secondary | ICD-10-CM

## 2023-08-10 DIAGNOSIS — Z131 Encounter for screening for diabetes mellitus: Secondary | ICD-10-CM

## 2023-08-10 DIAGNOSIS — Z98891 History of uterine scar from previous surgery: Secondary | ICD-10-CM

## 2023-08-10 DIAGNOSIS — O10919 Unspecified pre-existing hypertension complicating pregnancy, unspecified trimester: Secondary | ICD-10-CM

## 2023-08-10 DIAGNOSIS — D649 Anemia, unspecified: Secondary | ICD-10-CM

## 2023-08-10 NOTE — Progress Notes (Signed)
 ROB. Patient states daily fetal movement. Continuing to take ASA, Labetalol and prenatals. OB Anes consult and cardiology appointments scheduled for next week. Completed GCT and signed BTC today. Offer TDAP next visit. Patient states no questions or concerns at this time.

## 2023-08-10 NOTE — Progress Notes (Signed)
 ROB:  EGA 27.3   She reports that she is doing well.  Hypertension currently controlled on labetalol.  Begin NSTs at 32 weeks.  Has an anesthesia consult next week for elevated BMI.  She is having some issues with vaginal dryness-we have discussed this.  Vaginal lubricants recommended.  She has reaffirmed her desire for repeat cesarean delivery.  This should be scheduled at next visit - 38wks.  GCT today.

## 2023-08-11 LAB — 28 WEEK RH+PANEL
Basophils Absolute: 0 10*3/uL (ref 0.0–0.2)
Basos: 0 %
EOS (ABSOLUTE): 0.1 10*3/uL (ref 0.0–0.4)
Eos: 1 %
Gestational Diabetes Screen: 80 mg/dL (ref 70–139)
HIV Screen 4th Generation wRfx: NONREACTIVE
Hematocrit: 34.5 % (ref 34.0–46.6)
Hemoglobin: 11.1 g/dL (ref 11.1–15.9)
Immature Grans (Abs): 0.1 10*3/uL (ref 0.0–0.1)
Immature Granulocytes: 1 %
Lymphocytes Absolute: 1.4 10*3/uL (ref 0.7–3.1)
Lymphs: 12 %
MCH: 27.2 pg (ref 26.6–33.0)
MCHC: 32.2 g/dL (ref 31.5–35.7)
MCV: 85 fL (ref 79–97)
Monocytes Absolute: 0.8 10*3/uL (ref 0.1–0.9)
Monocytes: 7 %
Neutrophils Absolute: 9.5 10*3/uL — ABNORMAL HIGH (ref 1.4–7.0)
Neutrophils: 79 %
Platelets: 268 10*3/uL (ref 150–450)
RBC: 4.08 x10E6/uL (ref 3.77–5.28)
RDW: 13.1 % (ref 11.7–15.4)
RPR Ser Ql: NONREACTIVE
WBC: 11.8 10*3/uL — ABNORMAL HIGH (ref 3.4–10.8)

## 2023-08-16 ENCOUNTER — Inpatient Hospital Stay: Admission: RE | Admit: 2023-08-16 | Source: Ambulatory Visit

## 2023-08-18 ENCOUNTER — Encounter: Payer: Self-pay | Admitting: Obstetrics and Gynecology

## 2023-08-19 ENCOUNTER — Encounter: Payer: Self-pay | Admitting: Cardiology

## 2023-08-19 ENCOUNTER — Ambulatory Visit: Attending: Internal Medicine | Admitting: Cardiology

## 2023-08-19 ENCOUNTER — Other Ambulatory Visit (HOSPITAL_COMMUNITY): Payer: Self-pay

## 2023-08-19 VITALS — BP 142/70 | HR 83 | Ht 63.0 in | Wt 300.0 lb

## 2023-08-19 DIAGNOSIS — O10919 Unspecified pre-existing hypertension complicating pregnancy, unspecified trimester: Secondary | ICD-10-CM

## 2023-08-19 DIAGNOSIS — Z3A28 28 weeks gestation of pregnancy: Secondary | ICD-10-CM

## 2023-08-19 DIAGNOSIS — O99210 Obesity complicating pregnancy, unspecified trimester: Secondary | ICD-10-CM

## 2023-08-19 MED ORDER — LABETALOL HCL 200 MG PO TABS
200.0000 mg | ORAL_TABLET | Freq: Two times a day (BID) | ORAL | 3 refills | Status: DC
  Filled 2023-08-19: qty 180, 90d supply, fill #0

## 2023-08-19 MED ORDER — BLOOD PRESSURE MONITOR AUTOMAT DEVI
1.0000 [IU] | Freq: Once | 0 refills | Status: AC
  Filled 2023-08-19: qty 1, 30d supply, fill #0
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
## 2023-08-19 NOTE — Patient Instructions (Addendum)
 Medication Instructions:  Your physician has recommended you make the following change in your medication:  INCREASE: Labetalol 200 mg twice daily  *If you need a refill on your cardiac medications before your next appointment, please call your pharmacy*   Follow-Up: At [facility], you and your health needs are our priority.  As part of our continuing mission to provide you with exceptional heart care, our providers are all part of one team.  This team includes your primary Cardiologist (physician) and Advanced Practice Providers or APPs (Physician Assistants and Nurse Practitioners) who all work together to provide you with the care you need, when you need it.  Your next appointment:   8 week(s)  Provider:   Thomasene Ripple, DO     Other Instructions Please see our pharmacy staff at [facility] in 4 weeks.   Please take your blood pressure daily for 2 weeks and send in a MyChart message. Please include heart rates. (One message at the end of the 2 weeks).   HOW TO TAKE YOUR BLOOD PRESSURE: Rest 5 minutes before taking your blood pressure. Don't smoke or drink caffeinated beverages for at least 30 minutes before. Take your blood pressure before (not after) you eat. Sit comfortably with your back supported and both feet on the floor (don't cross your legs). Elevate your arm to heart level on a table or a desk. Use the proper sized cuff. It should fit smoothly and snugly around your bare upper arm. There should be enough room to slip a fingertip under the cuff. The bottom edge of the cuff should be 1 inch above the crease of the elbow. Ideally, take 3 measurements at one sitting and record the average.       1st Floor: - Lobby - Registration  - Pharmacy  - Lab - Cafe  2nd Floor: - PV Lab - Diagnostic Testing (echo, CT, nuclear med)  3rd Floor: - Vacant  4th Floor: - TCTS (cardiothoracic surgery) - AFib Clinic - Structural Heart Clinic - Vascular Surgery   - Vascular Ultrasound  5th Floor: - [facility] (general and EP) - Clinical Pharmacy for coumadin, hypertension, lipid, weight-loss medications, and med management appointments    Valet parking services will be available as well.

## 2023-08-20 ENCOUNTER — Other Ambulatory Visit: Payer: Self-pay | Admitting: *Deleted

## 2023-08-20 ENCOUNTER — Ambulatory Visit (HOSPITAL_BASED_OUTPATIENT_CLINIC_OR_DEPARTMENT_OTHER): Admitting: Obstetrics and Gynecology

## 2023-08-20 ENCOUNTER — Ambulatory Visit: Payer: Medicaid Other | Attending: Maternal & Fetal Medicine

## 2023-08-20 ENCOUNTER — Ambulatory Visit: Payer: Medicaid Other

## 2023-08-20 VITALS — BP 141/73 | HR 77

## 2023-08-20 VITALS — BP 137/73 | HR 91

## 2023-08-20 DIAGNOSIS — Z6841 Body Mass Index (BMI) 40.0 and over, adult: Secondary | ICD-10-CM | POA: Insufficient documentation

## 2023-08-20 DIAGNOSIS — O321XX0 Maternal care for breech presentation, not applicable or unspecified: Secondary | ICD-10-CM | POA: Insufficient documentation

## 2023-08-20 DIAGNOSIS — O99213 Obesity complicating pregnancy, third trimester: Secondary | ICD-10-CM | POA: Insufficient documentation

## 2023-08-20 DIAGNOSIS — O10913 Unspecified pre-existing hypertension complicating pregnancy, third trimester: Secondary | ICD-10-CM | POA: Insufficient documentation

## 2023-08-20 DIAGNOSIS — J45909 Unspecified asthma, uncomplicated: Secondary | ICD-10-CM | POA: Insufficient documentation

## 2023-08-20 DIAGNOSIS — O10919 Unspecified pre-existing hypertension complicating pregnancy, unspecified trimester: Secondary | ICD-10-CM | POA: Diagnosis not present

## 2023-08-20 DIAGNOSIS — Z3689 Encounter for other specified antenatal screening: Secondary | ICD-10-CM | POA: Insufficient documentation

## 2023-08-20 DIAGNOSIS — Z98891 History of uterine scar from previous surgery: Secondary | ICD-10-CM

## 2023-08-20 DIAGNOSIS — Z3A28 28 weeks gestation of pregnancy: Secondary | ICD-10-CM | POA: Insufficient documentation

## 2023-08-20 DIAGNOSIS — O99513 Diseases of the respiratory system complicating pregnancy, third trimester: Secondary | ICD-10-CM | POA: Diagnosis not present

## 2023-08-20 DIAGNOSIS — O10013 Pre-existing essential hypertension complicating pregnancy, third trimester: Secondary | ICD-10-CM

## 2023-08-20 DIAGNOSIS — Z79899 Other long term (current) drug therapy: Secondary | ICD-10-CM | POA: Diagnosis not present

## 2023-08-20 DIAGNOSIS — O99212 Obesity complicating pregnancy, second trimester: Secondary | ICD-10-CM | POA: Insufficient documentation

## 2023-08-20 DIAGNOSIS — Z3480 Encounter for supervision of other normal pregnancy, unspecified trimester: Secondary | ICD-10-CM | POA: Diagnosis present

## 2023-08-20 NOTE — Progress Notes (Signed)
  Maternal-Fetal Medicine Consultation Name: Taline Nass MRN: [id number]  GA-28 weeks and 6 days.  Patient with chronic hypertension return for fetal growth assessment.  Recently, labetalol dosage was increased to 200 mg twice daily.  Patient did not take her blood pressure medications today morning because she was feeling unwell after she took her last night's dose.  Blood pressures today at our office were 141/73 and 151/77. She does not have gestational diabetes.  Ultrasound Fetal growth is appropriate for gestational age.  Amniotic fluid is normal and good fetal activity seen.  I counseled the patient on the importance of taking antihypertensives regularly to prevent maternal complications.  I recommended that she take antihypertensives at our office and recheck her blood pressure after labetalol. Patient took labetalol 200 mg at our office.  On rechecking after 30 minutes, blood pressure was 151/77.  mmHg.  On repeating after 15 minutes, blood pressure was 137/73 mmHg.  She did not have signs and symptoms of severe features of preeclampsia. I advised her to continue taking labetalol 200 mg twice daily. I encouraged her to buy a blood pressure monitor to check her blood pressures at home.  Recommendations -An appointment was made for her to return in 4 weeks for fetal growth assessment and BPP. -Weekly BPP from [redacted] weeks gestation till delivery.  Consultation including face-to-face (more than 50%) counseling 20 minutes.

## 2023-08-24 ENCOUNTER — Encounter
Admission: RE | Admit: 2023-08-24 | Discharge: 2023-08-24 | Disposition: A | Discharge status: Home / Self Care | Source: Ambulatory Visit | Attending: Anesthesiology | Admitting: Anesthesiology

## 2023-08-31 ENCOUNTER — Encounter: Admitting: Obstetrics and Gynecology

## 2023-08-31 DIAGNOSIS — O10919 Unspecified pre-existing hypertension complicating pregnancy, unspecified trimester: Secondary | ICD-10-CM

## 2023-08-31 DIAGNOSIS — Z98891 History of uterine scar from previous surgery: Secondary | ICD-10-CM

## 2023-08-31 DIAGNOSIS — Z3A30 30 weeks gestation of pregnancy: Secondary | ICD-10-CM

## 2023-08-31 DIAGNOSIS — O0993 Supervision of high risk pregnancy, unspecified, third trimester: Secondary | ICD-10-CM

## 2023-08-31 DIAGNOSIS — Z23 Encounter for immunization: Secondary | ICD-10-CM

## 2023-09-02 ENCOUNTER — Encounter: Payer: Self-pay | Admitting: Obstetrics and Gynecology

## 2023-09-02 ENCOUNTER — Encounter: Payer: Self-pay | Admitting: Urgent Care

## 2023-09-02 ENCOUNTER — Ambulatory Visit (INDEPENDENT_AMBULATORY_CARE_PROVIDER_SITE_OTHER): Admitting: Obstetrics and Gynecology

## 2023-09-02 VITALS — BP 131/83 | HR 97 | Wt 298.2 lb

## 2023-09-02 DIAGNOSIS — Z23 Encounter for immunization: Secondary | ICD-10-CM

## 2023-09-02 DIAGNOSIS — Z3A30 30 weeks gestation of pregnancy: Secondary | ICD-10-CM | POA: Diagnosis not present

## 2023-09-02 DIAGNOSIS — O10919 Unspecified pre-existing hypertension complicating pregnancy, unspecified trimester: Secondary | ICD-10-CM

## 2023-09-02 DIAGNOSIS — O10013 Pre-existing essential hypertension complicating pregnancy, third trimester: Secondary | ICD-10-CM

## 2023-09-02 DIAGNOSIS — Z98891 History of uterine scar from previous surgery: Secondary | ICD-10-CM

## 2023-09-02 DIAGNOSIS — O34219 Maternal care for unspecified type scar from previous cesarean delivery: Secondary | ICD-10-CM | POA: Diagnosis not present

## 2023-09-02 DIAGNOSIS — O0992 Supervision of high risk pregnancy, unspecified, second trimester: Secondary | ICD-10-CM

## 2023-09-02 NOTE — Progress Notes (Signed)
 ROB:  EGA = 30.5.  She has no complaints today.  She feels the baby move daily.  She is not taking labetalol  200 twice daily.  Blood pressures are controlled.  Has ultrasound follow-up appointment with MFM in 2 weeks. She has decided that she would like a repeat cesarean delivery.  Risk benefits of repeat cesarean delivery versus TOLAC discussed in detail.  Patient is sure she wants a repeat cesarean delivery.  This was scheduled for June 16.  Scheduled during her early 38-week for chronic hypertension controlled on medication.

## 2023-09-02 NOTE — Progress Notes (Signed)
 ROB. She states daily fetal movement. Reports continuing to take 200 mg Labetalol  BID which no complaints. TDAP administered today. OB Anes. consult on 4/15. Reports she is considering a repeat cesarean rather than a VBAC. She states no questions or concerns at this time.

## 2023-09-09 ENCOUNTER — Other Ambulatory Visit (HOSPITAL_COMMUNITY)
Admission: RE | Admit: 2023-09-09 | Discharge: 2023-09-09 | Disposition: A | Discharge status: Home / Self Care | Source: Ambulatory Visit | Attending: Certified Nurse Midwife | Admitting: Certified Nurse Midwife

## 2023-09-09 ENCOUNTER — Ambulatory Visit

## 2023-09-09 VITALS — BP 138/88 | HR 109 | Temp 98.7°F | Resp 16 | Ht 63.0 in | Wt 300.2 lb

## 2023-09-09 DIAGNOSIS — N898 Other specified noninflammatory disorders of vagina: Secondary | ICD-10-CM | POA: Insufficient documentation

## 2023-09-09 DIAGNOSIS — R350 Frequency of micturition: Secondary | ICD-10-CM | POA: Diagnosis not present

## 2023-09-09 DIAGNOSIS — R399 Unspecified symptoms and signs involving the genitourinary system: Secondary | ICD-10-CM | POA: Insufficient documentation

## 2023-09-09 LAB — POCT URINALYSIS DIPSTICK OB
Bilirubin, UA: NEGATIVE
Blood, UA: NEGATIVE
Clarity, UA: NEGATIVE
Glucose, UA: NEGATIVE
Ketones, UA: NEGATIVE
Leukocytes, UA: NEGATIVE
Nitrite, UA: NEGATIVE
POC,PROTEIN,UA: NEGATIVE
Spec Grav, UA: 1.01 (ref 1.010–1.025)
Urobilinogen, UA: 0.2 U/dL — AB
pH, UA: 6 (ref 5.0–8.0)

## 2023-09-09 NOTE — Progress Notes (Signed)
    NURSE VISIT NOTE  Subjective:    Patient ID: Joyce Smith, female    DOB: [birth year], 23 y.o.   MRN: [id number]  HPI  Patient is a 23 y.o. G77P1011 female who presents for vaginal irritation.Denies abnormal vaginal bleeding or significant pelvic pain or fever. admits to urinary frequency, genital irritation, and urinary retention . Patient denies history of known exposure to STD.   Objective:    BP 138/88   Pulse (!) 109   Temp 98.7 F (37.1 C)   Resp 16   Ht 5\' 3"  (1.6 m)   Wt (!) 300 lb 3.2 oz (136.2 kg)   LMP 01/30/2023 (Exact Date)   BMI 53.18 kg/m    @THIS  VISIT ONLY@  Assessment:   1. UTI symptoms   2. Vaginal irritation   3. Urinary frequency     bacterial vaginosis, trichomonas, rule out GC or chlamydia, and nonspecific vaginitis  Plan:   GC and chlamydia DNA  probe sent to lab. Treatment: advised patient to use hydrocortisone ointment, Desitin cream, or external monistat cream. She has scheduled an appointment for tomorrow for evaluation.  ROV prn if symptoms persist or worsen.   Doneen Fuelling, CMA [facility]

## 2023-09-10 ENCOUNTER — Encounter: Admitting: Certified Nurse Midwife

## 2023-09-11 LAB — URINE CULTURE

## 2023-09-13 ENCOUNTER — Encounter: Payer: Self-pay | Admitting: Certified Nurse Midwife

## 2023-09-13 LAB — CERVICOVAGINAL ANCILLARY ONLY
Bacterial Vaginitis (gardnerella): NEGATIVE
Candida Glabrata: NEGATIVE
Candida Vaginitis: NEGATIVE
Chlamydia: NEGATIVE
Comment: NEGATIVE
Comment: NEGATIVE
Comment: NEGATIVE
Comment: NEGATIVE
Comment: NEGATIVE
Comment: NORMAL
Neisseria Gonorrhea: NEGATIVE
Trichomonas: NEGATIVE
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
## 2023-09-15 ENCOUNTER — Observation Stay: Admission: EM | Admit: 2023-09-15 | Discharge: 2023-09-16 | Disposition: A | Discharge status: Home / Self Care

## 2023-09-15 ENCOUNTER — Encounter: Admitting: Certified Nurse Midwife

## 2023-09-15 DIAGNOSIS — O26893 Other specified pregnancy related conditions, third trimester: Secondary | ICD-10-CM | POA: Diagnosis present

## 2023-09-15 DIAGNOSIS — Z3A32 32 weeks gestation of pregnancy: Secondary | ICD-10-CM | POA: Diagnosis not present

## 2023-09-15 DIAGNOSIS — M549 Dorsalgia, unspecified: Secondary | ICD-10-CM | POA: Diagnosis not present

## 2023-09-15 DIAGNOSIS — M79662 Pain in left lower leg: Secondary | ICD-10-CM | POA: Insufficient documentation

## 2023-09-15 DIAGNOSIS — Z3480 Encounter for supervision of other normal pregnancy, unspecified trimester: Secondary | ICD-10-CM

## 2023-09-15 DIAGNOSIS — O99891 Other specified diseases and conditions complicating pregnancy: Secondary | ICD-10-CM | POA: Diagnosis not present

## 2023-09-15 DIAGNOSIS — M79606 Pain in leg, unspecified: Principal | ICD-10-CM | POA: Diagnosis present

## 2023-09-15 NOTE — OB Triage Note (Signed)
 Pt is a G3P1 with c/o L lower leg cramp throughout the day. She also c/o back pain and abd cramps. Neg homans to R and L leg. Pt c/o left leg cramp and tenderness on shin area below knee. Pt endorses fetal movement and baby is very active on monitor. FHR 150's  Pt is a prev C/S and repeat scheduled for June 16th. She take labetaol and baby aspirin  for BP. She was takin Magnesium  Gluconate fro sleep but stopped 2 weeks ago.

## 2023-09-16 ENCOUNTER — Telehealth: Payer: Self-pay

## 2023-09-16 ENCOUNTER — Other Ambulatory Visit (HOSPITAL_COMMUNITY)
Admission: RE | Admit: 2023-09-16 | Discharge: 2023-09-16 | Disposition: A | Discharge status: Home / Self Care | Source: Ambulatory Visit | Attending: Obstetrics | Admitting: Obstetrics

## 2023-09-16 ENCOUNTER — Encounter: Payer: Self-pay | Admitting: Obstetrics

## 2023-09-16 ENCOUNTER — Encounter: Payer: Self-pay | Admitting: Obstetrics and Gynecology

## 2023-09-16 ENCOUNTER — Ambulatory Visit (INDEPENDENT_AMBULATORY_CARE_PROVIDER_SITE_OTHER): Admitting: Obstetrics

## 2023-09-16 VITALS — BP 124/81 | HR 88 | Wt 296.0 lb

## 2023-09-16 DIAGNOSIS — M79605 Pain in left leg: Secondary | ICD-10-CM | POA: Diagnosis not present

## 2023-09-16 DIAGNOSIS — Z3A32 32 weeks gestation of pregnancy: Secondary | ICD-10-CM

## 2023-09-16 DIAGNOSIS — O99891 Other specified diseases and conditions complicating pregnancy: Secondary | ICD-10-CM | POA: Diagnosis not present

## 2023-09-16 DIAGNOSIS — N898 Other specified noninflammatory disorders of vagina: Secondary | ICD-10-CM | POA: Diagnosis present

## 2023-09-16 LAB — POCT URINALYSIS DIPSTICK OB
Bilirubin, UA: NEGATIVE
Blood, UA: NEGATIVE
Glucose, UA: NEGATIVE
Ketones, UA: NEGATIVE
Leukocytes, UA: NEGATIVE
Nitrite, UA: NEGATIVE
POC,PROTEIN,UA: NEGATIVE
Spec Grav, UA: 1.01 (ref 1.010–1.025)
Urobilinogen, UA: 0.2 U/dL
pH, UA: 6 (ref 5.0–8.0)

## 2023-09-16 MED ORDER — MAGNESIUM OXIDE -MG SUPPLEMENT 400 (240 MG) MG PO TABS
400.0000 mg | ORAL_TABLET | Freq: Once | ORAL | Status: AC
Start: 2023-09-16 — End: 2023-09-16
  Administered 2023-09-16: 400 mg via ORAL
  Filled 2023-09-16: qty 1

## 2023-09-16 NOTE — OB Triage Note (Signed)
 LABOR & DELIVERY OB TRIAGE NOTE  SUBJECTIVE  HPI Joyce Smith is a 23 y.o. G3P1011 at [redacted]w[redacted]d who presents to Labor & Delivery for complaint of cramp in lower left leg. She also complains of sharp pain in her lower mid back and sharp, shooting intermittent vaginal pain.   She has experienced "Charlie Horse" cramps in her legs over the past week that have been short-lived. This evening while driving, she started noting cramping in her lower left calf that radiated up towards her knee and down her shin. She also endorsed some numbness and tingling in her toes. Of note, she had been taking 250mg  magnesium  gluconate nightly to help with sleep but felt it made her too sleepy and so stopped taking about two weeks ago.   She has been experiencing ongoing back pain throughout this pregnancy, but it has just recently worsened and become sharper.   She also has soreness and tenderness on her right rib cage.   She was seen on 5/1 and had UTI and other vaginal infection ruled out.   She denies contractions, LOF, and vaginal bleeding.   OB History     Gravida  3   Para  1   Term  1   Preterm  0   AB  1   Living  1      SAB  1   IAB  0   Ectopic  0   Multiple  0   Live Births  1           OBJECTIVE  BP (!) 142/53   Pulse 92   Temp 98.4 F (36.9 C) (Oral)   Resp 18   LMP 01/30/2023 (Exact Date)   General: AOx3 Heart: Normal heart rate Lungs: Normal work of breathing Abdomen: gravid, soft, non-tender Extremities: Tenderness of lower left calf on palpation, no erythema, calf circumference visually equivalent, +1 bilateral lower extremity edema, negative Homan's.  Genital exam:  Erythema noted across vulva  NST I reviewed the NST and it was reactive.  Baseline: 140 bpm Variability: moderate Accelerations: greater than 2 15 x15 bpm Decelerations:none Toco: uterine irritability Category I  ASSESSMENT Impression  1) Pregnancy at G3P1011, [redacted]w[redacted]d, Estimated  Date of Delivery: 11/06/23 2) Reassuring maternal/fetal status 3) Low suspicion for DVT given equal symmetry of calf circumference, lack of erythema, recent increase in leg cramps after discontinuation of magnesium .  4) Vaginal irritation (infection ruled out on 5/1). 5) MSK pain in back and rib cage related to pregnancy.   PLAN 1) Offered US  to rule out DVT, patient declines at this time. Reviewed s/s of DVT including increased swelling in one calf as compared to the other, erythema, increasing pain. Recommended she return for further evaluation if any of these symptoms arise.  2) Recommended restarting magnesium  supplement to help with new onset of leg cramps. Dose given in OB triage.  3) Discussed comfort measures for vaginal irritation. Provided with barrier cream for symptom management.  4) Discussed comfort measures for MSK pain related to pregnancy.  5) Continue with routine prenatal care.   Verita Glassman Serah Nicoletti, CNM  09/16/23  12:14 AM

## 2023-09-16 NOTE — Assessment & Plan Note (Signed)
 Venous US ordered.

## 2023-09-16 NOTE — Progress Notes (Signed)
    Return Prenatal Note   Assessment/Plan   Plan  23 y.o. G3P1011 at [redacted]w[redacted]d presents for a problem visit. Reviewed prenatal record including previous visit note.  Leg pain -Venous US  ordered  Vaginal irritation -Swabs  collected -Encouraged loose clothing, sitz baths, Vagisil   Orders Placed This Encounter  Procedures   POC Urinalysis Dipstick OB   No follow-ups on file.   Future Appointments  Date Time Provider Department Center  09/17/2023  9:00 AM [facility] PROVIDER 1 [facility]  09/17/2023  9:30 AM [facility] US1 [facility]  09/17/2023  4:20 PM Sunny English, [facility] None  09/22/2023 10:35 AM Zenobia Hila, MD [facility] None  09/27/2023  8:00 AM [facility] PROVIDER 1 [facility]  09/27/2023  8:30 AM [facility] US2 [facility]  10/05/2023  2:00 PM [facility] PROVIDER 1 [facility]  10/05/2023  2:30 PM [facility] US5 [facility]  10/12/2023  3:00 PM [facility] PROVIDER 1 [facility]  10/12/2023  3:30 PM [facility] US3 [facility]  10/18/2023 11:00 AM [facility] PAT1 [facility] None  10/19/2023  9:00 AM Tobb, Kardie, DO [facility] H&V    For next visit:  Routine prenatal care    Subjective   Joyce Smith presents today for evaluation of vaginal irritation and pain in her left calf. She reports that she has had increasing vaginal pain and irritation. She is having pain with any touch or penetration at the introitus. She denies significant discharge. She also reports pain in the back of her left calf. It felt like a charlie horse initially, and now she is feeling tingling and numbness that wraps around. It is a little tender to touch. She was seen in triage last night but did not want to wait several hours for an US .  Movement: Present Contractions: Not present  Objective   Flow sheet Vitals: Pulse Rate: 88 BP: 124/81 Fetal Heart Rate (bpm): 140 Total weight gain: 24 lb (10.9 kg)  General Appearance  No acute distress, well appearing, and well nourished Pulmonary   Normal work of  breathing Neurologic   Alert and oriented to person, place, and time Psychiatric   Mood and affect within normal limits Pelvic:    Introitus and inner labia appear irritated. Small amount of adherent white discharge on labia. Area of irritation/small fissure between inner and outer labia. Lower extremities:  No edema. Symmetric. Mild tenderness to palpation on left calf.  Josue Nip, CNM 09/16/23 12:06 PM

## 2023-09-16 NOTE — Telephone Encounter (Signed)
 I called Joyce Smith this morning as she called after hours nurse triage line about some serve leg cramping. I left a voicemail as her phone was off, she also has an appointment today with Josue Nip, CNM so I made her CMA aware.

## 2023-09-16 NOTE — Assessment & Plan Note (Signed)
-  Swabs  collected -Encouraged loose clothing, sitz baths, Vagisil

## 2023-09-17 ENCOUNTER — Ambulatory Visit: Payer: Medicaid Other

## 2023-09-17 ENCOUNTER — Ambulatory Visit: Admitting: Pharmacist

## 2023-09-17 ENCOUNTER — Other Ambulatory Visit

## 2023-09-17 LAB — CERVICOVAGINAL ANCILLARY ONLY
Bacterial Vaginitis (gardnerella): NEGATIVE
Candida Glabrata: NEGATIVE
Candida Vaginitis: NEGATIVE
Chlamydia: NEGATIVE
Comment: NEGATIVE
Comment: NEGATIVE
Comment: NEGATIVE
Comment: NEGATIVE
Comment: NEGATIVE
Comment: NORMAL
Neisseria Gonorrhea: NEGATIVE
Trichomonas: NEGATIVE
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
## 2023-09-19 LAB — HERPES SIMPLEX VIRUS CULTURE

## 2023-09-20 ENCOUNTER — Other Ambulatory Visit: Payer: Self-pay

## 2023-09-20 ENCOUNTER — Encounter: Payer: Self-pay | Admitting: Emergency Medicine

## 2023-09-20 ENCOUNTER — Ambulatory Visit
Admission: RE | Admit: 2023-09-20 | Discharge: 2023-09-20 | Disposition: A | Discharge status: Home / Self Care | Source: Ambulatory Visit | Attending: Obstetrics | Admitting: Obstetrics

## 2023-09-20 ENCOUNTER — Observation Stay: Admission: EM | Admit: 2023-09-20 | Discharge: 2023-09-20 | Disposition: A | Discharge status: Home / Self Care

## 2023-09-20 DIAGNOSIS — D649 Anemia, unspecified: Secondary | ICD-10-CM | POA: Insufficient documentation

## 2023-09-20 DIAGNOSIS — M79605 Pain in left leg: Secondary | ICD-10-CM | POA: Insufficient documentation

## 2023-09-20 DIAGNOSIS — O133 Gestational [pregnancy-induced] hypertension without significant proteinuria, third trimester: Secondary | ICD-10-CM | POA: Diagnosis present

## 2023-09-20 DIAGNOSIS — O163 Unspecified maternal hypertension, third trimester: Secondary | ICD-10-CM

## 2023-09-20 DIAGNOSIS — M7122 Synovial cyst of popliteal space [Baker], left knee: Secondary | ICD-10-CM | POA: Insufficient documentation

## 2023-09-20 DIAGNOSIS — O99013 Anemia complicating pregnancy, third trimester: Secondary | ICD-10-CM | POA: Diagnosis not present

## 2023-09-20 DIAGNOSIS — Z3A33 33 weeks gestation of pregnancy: Secondary | ICD-10-CM | POA: Diagnosis not present

## 2023-09-20 DIAGNOSIS — O99891 Other specified diseases and conditions complicating pregnancy: Secondary | ICD-10-CM | POA: Insufficient documentation

## 2023-09-20 DIAGNOSIS — Z3480 Encounter for supervision of other normal pregnancy, unspecified trimester: Principal | ICD-10-CM

## 2023-09-20 LAB — CBC
HCT: 33.5 % — ABNORMAL LOW (ref 36.0–46.0)
Hemoglobin: 10.4 g/dL — ABNORMAL LOW (ref 12.0–15.0)
MCH: 26.1 pg (ref 26.0–34.0)
MCHC: 31 g/dL (ref 30.0–36.0)
MCV: 84 fL (ref 80.0–100.0)
Platelets: 262 10*3/uL (ref 150–400)
RBC: 3.99 MIL/uL (ref 3.87–5.11)
RDW: 13.8 % (ref 11.5–15.5)
WBC: 11.9 10*3/uL — ABNORMAL HIGH (ref 4.0–10.5)
nRBC: 0 % (ref 0.0–0.2)

## 2023-09-20 LAB — BASIC METABOLIC PANEL WITH GFR
Anion gap: 10 (ref 5–15)
BUN: 7 mg/dL (ref 6–20)
CO2: 22 mmol/L (ref 22–32)
Calcium: 8.6 mg/dL — ABNORMAL LOW (ref 8.9–10.3)
Chloride: 103 mmol/L (ref 98–111)
Creatinine, Ser: 0.6 mg/dL (ref 0.44–1.00)
GFR, Estimated: >60 mL/min (ref >60)
Glucose, Bld: 104 mg/dL — ABNORMAL HIGH (ref 70–99)
Potassium: 3.8 mmol/L (ref 3.5–5.1)
Sodium: 135 mmol/L (ref 135–145)

## 2023-09-20 LAB — TROPONIN I (HIGH SENSITIVITY): Troponin I (High Sensitivity): 4 ng/L (ref <18)

## 2023-09-20 MED ORDER — ACCRUFER 30 MG PO CAPS: 1.0000 | ORAL_CAPSULE | Freq: Two times a day (BID) | ORAL | 6 refills | Status: DC

## 2023-09-20 NOTE — OB Triage Note (Signed)
 Discharge instructions, labor precautions, and follow-up care reviewed with patient and significant other. All questions answered. Patient verbalized understanding. Discharged ambulatory off unit.

## 2023-09-20 NOTE — ED Triage Notes (Signed)
 Patient to ED via POV for left leg pain. States she was seen here the other night on L&D for same but left due to US  backup. Currently [redacted] weeks pregnant. States she had US  done this AM.- unsure of results. States she is getting lightheaded and dizzy while stand for long periods of time and wanted to be checked out further. Denies concerns with baby at this time. State she was also having "heart flutters" when she was walking into ED.

## 2023-09-20 NOTE — OB Triage Note (Signed)
 LABOR & DELIVERY OB TRIAGE NOTE  SUBJECTIVE  HPI Joyce Smith is a 23 y.o. G3P1011 at [redacted]w[redacted]d who presents to Labor & Delivery for evaluation of elevated blood pressure.   Joyce Smith presented to the emergency department with complaints of light-headedness and SOB. Her initial BP in the ED was recorded as 175/74 and she was sent to [facility] triage after initial labs and ECG were done. On arriving in L&D, the patient's BP was 139/62, and she reported that they used a much smaller cuff when taking her BP in the ED. She continued to endorse SOB and was placed on pulse ox and found to be have O2 sats in the high 90s.   She also reports continued lower left calf pain now radiating up her left posterior thigh. She had an ultrasound completed earlier today which show's no signs of DVT but is positive for a popliteal Baker's cyst.   She endorses normal fetal movement and denies contractions, LOF, and vaginal bleeding.   OB History     Gravida  3   Para  1   Term  1   Preterm  0   AB  1   Living  1      SAB  1   IAB  0   Ectopic  0   Multiple  0   Live Births  1           Scheduled Meds: Continuous Infusions: PRN Meds:.  OBJECTIVE  BP 139/62   Pulse 94   Temp 99.1 F (37.3 C) (Oral)   Resp 20   Ht 5\' 4"  (1.626 m)   Wt 135.2 kg   LMP 01/30/2023 (Exact Date)   SpO2 97%   BMI 51.15 kg/m   General: AOx3 Heart: normal heart rate Lungs: visually normal work of breathing Abdomen: gravid, soft, non-tender Cervical exam:   deferred  NST I reviewed the NST and it was reactive.  Baseline: 155 bpm Variability: moderate Accelerations: > 2 15x15 bpm Decelerations: none Toco: quiet Category I  ASSESSMENT Impression  1) Pregnancy at G3P1011, [redacted]w[redacted]d, Estimated Date of Delivery: 11/06/23 2) Reassuring maternal/fetal status 3) Per Dr. Cam Cava in the ED, ECG is unremarkable and unchanged from previous EKG. She is cleared from an ED perspective.  4) Blood pressure  normotensive in OB triage.  5) Popliteal Baker's cyst  6) Anemia-hgb 10.4   PLAN 1) Reviewed reassuring results from ED work up.  2) Discussed anemia. She had previously been taking an iron supplement but found that it upset her stomach so she stopped. Will order Accrufer and also provide other resources for increasing iron in her diet.  3) Referral to ortho for management of Baker's cyst.  4) Continue with routine prenatal care.   Joyce Smith, CNM  09/20/23  4:23 PM

## 2023-09-20 NOTE — ED Notes (Signed)
 Call OB per Dr. Martina Sledge request due to pts BP. L & D is going to see pt.

## 2023-09-20 NOTE — OB Triage Note (Signed)
 Patient is a G3P1011 at [redacted]w[redacted]d who presents to unit c/o left leg pain and lightheadedness when standing too long. Reports +fetal movement, denies contractions, vaginal bleeding, and leakage of fluid. External monitors applied and assessing. Initial FHT 160. Vital signs WDL.

## 2023-09-21 ENCOUNTER — Ambulatory Visit: Attending: Obstetrics and Gynecology | Admitting: Maternal & Fetal Medicine

## 2023-09-21 ENCOUNTER — Ambulatory Visit: Payer: Self-pay | Admitting: Obstetrics

## 2023-09-21 ENCOUNTER — Other Ambulatory Visit: Payer: Self-pay | Admitting: *Deleted

## 2023-09-21 ENCOUNTER — Ambulatory Visit (HOSPITAL_BASED_OUTPATIENT_CLINIC_OR_DEPARTMENT_OTHER)

## 2023-09-21 VITALS — BP 126/64 | HR 88

## 2023-09-21 DIAGNOSIS — Z3A32 32 weeks gestation of pregnancy: Secondary | ICD-10-CM

## 2023-09-21 DIAGNOSIS — Z3A33 33 weeks gestation of pregnancy: Secondary | ICD-10-CM | POA: Insufficient documentation

## 2023-09-21 DIAGNOSIS — O99213 Obesity complicating pregnancy, third trimester: Secondary | ICD-10-CM | POA: Diagnosis present

## 2023-09-21 DIAGNOSIS — J45909 Unspecified asthma, uncomplicated: Secondary | ICD-10-CM | POA: Diagnosis not present

## 2023-09-21 DIAGNOSIS — O10913 Unspecified pre-existing hypertension complicating pregnancy, third trimester: Secondary | ICD-10-CM

## 2023-09-21 DIAGNOSIS — O99212 Obesity complicating pregnancy, second trimester: Secondary | ICD-10-CM

## 2023-09-21 DIAGNOSIS — Z3483 Encounter for supervision of other normal pregnancy, third trimester: Secondary | ICD-10-CM

## 2023-09-21 DIAGNOSIS — E669 Obesity, unspecified: Secondary | ICD-10-CM | POA: Diagnosis not present

## 2023-09-21 DIAGNOSIS — O99513 Diseases of the respiratory system complicating pregnancy, third trimester: Secondary | ICD-10-CM

## 2023-09-21 DIAGNOSIS — O10013 Pre-existing essential hypertension complicating pregnancy, third trimester: Secondary | ICD-10-CM

## 2023-09-21 DIAGNOSIS — Z3480 Encounter for supervision of other normal pregnancy, unspecified trimester: Secondary | ICD-10-CM

## 2023-09-21 DIAGNOSIS — Z362 Encounter for other antenatal screening follow-up: Secondary | ICD-10-CM | POA: Insufficient documentation

## 2023-09-21 DIAGNOSIS — O10919 Unspecified pre-existing hypertension complicating pregnancy, unspecified trimester: Secondary | ICD-10-CM

## 2023-09-21 NOTE — Progress Notes (Signed)
 After review, MFM consult with provider is not indicated for today  Penney Bowling, DO 09/21/2023 8:37 AM  [facility]

## 2023-09-22 ENCOUNTER — Ambulatory Visit (INDEPENDENT_AMBULATORY_CARE_PROVIDER_SITE_OTHER): Admitting: Obstetrics and Gynecology

## 2023-09-22 ENCOUNTER — Encounter: Payer: Self-pay | Admitting: Obstetrics and Gynecology

## 2023-09-22 VITALS — BP 128/82 | HR 98 | Wt 301.8 lb

## 2023-09-22 DIAGNOSIS — O10919 Unspecified pre-existing hypertension complicating pregnancy, unspecified trimester: Secondary | ICD-10-CM | POA: Diagnosis not present

## 2023-09-22 DIAGNOSIS — Z3A33 33 weeks gestation of pregnancy: Secondary | ICD-10-CM

## 2023-09-22 DIAGNOSIS — O0993 Supervision of high risk pregnancy, unspecified, third trimester: Secondary | ICD-10-CM

## 2023-09-22 DIAGNOSIS — O99210 Obesity complicating pregnancy, unspecified trimester: Secondary | ICD-10-CM

## 2023-09-22 NOTE — Progress Notes (Signed)
 ROB:  EGA = 33.4.  cHTN currently on labetalol  twice daily.  Pressures are good.  Biophysical profile yesterday at [facility] 8 out of 8.  Growth 45%.  Spoke with Dr. Andrey Keas today and he says they do not have to see her any further unless there is some future complication.  He says appropriate antenatal surveillance includes weekly NSTs.  (Patient has also requested not to continue biophysical profiles in [place] as this is quite an inconvenience for her.) Repeat cesarean delivery scheduled for 37 weeks. Reports daily fetal movement.

## 2023-09-22 NOTE — Progress Notes (Signed)
 ROB. Patient states daily fetal movement. MFM growth ultrasound yesterday. Patient states no questions or concerns at this time.

## 2023-09-24 ENCOUNTER — Other Ambulatory Visit: Payer: Self-pay | Admitting: Obstetrics

## 2023-09-27 ENCOUNTER — Other Ambulatory Visit

## 2023-09-27 ENCOUNTER — Ambulatory Visit

## 2023-10-01 ENCOUNTER — Other Ambulatory Visit

## 2023-10-01 ENCOUNTER — Encounter: Payer: Self-pay | Admitting: Certified Nurse Midwife

## 2023-10-01 ENCOUNTER — Ambulatory Visit (INDEPENDENT_AMBULATORY_CARE_PROVIDER_SITE_OTHER): Admitting: Certified Nurse Midwife

## 2023-10-01 VITALS — BP 124/71 | HR 96 | Wt 301.5 lb

## 2023-10-01 DIAGNOSIS — O10013 Pre-existing essential hypertension complicating pregnancy, third trimester: Secondary | ICD-10-CM

## 2023-10-01 DIAGNOSIS — Z98891 History of uterine scar from previous surgery: Secondary | ICD-10-CM

## 2023-10-01 DIAGNOSIS — Z3A34 34 weeks gestation of pregnancy: Secondary | ICD-10-CM

## 2023-10-01 DIAGNOSIS — N898 Other specified noninflammatory disorders of vagina: Secondary | ICD-10-CM

## 2023-10-01 DIAGNOSIS — O34219 Maternal care for unspecified type scar from previous cesarean delivery: Secondary | ICD-10-CM | POA: Diagnosis not present

## 2023-10-01 DIAGNOSIS — Z3480 Encounter for supervision of other normal pregnancy, unspecified trimester: Secondary | ICD-10-CM

## 2023-10-01 DIAGNOSIS — O99891 Other specified diseases and conditions complicating pregnancy: Secondary | ICD-10-CM

## 2023-10-01 DIAGNOSIS — O10919 Unspecified pre-existing hypertension complicating pregnancy, unspecified trimester: Secondary | ICD-10-CM

## 2023-10-01 NOTE — Assessment & Plan Note (Signed)
 Improved over the past two weeks but still present.

## 2023-10-01 NOTE — Progress Notes (Signed)
    Return Prenatal Note   Assessment/Plan   Plan  23 y.o. G3P1011 at [redacted]w[redacted]d presents for follow-up OB visit. Reviewed prenatal record including previous visit note.  Vaginal irritation Improved over the past two weeks but still present.   History of cesarean delivery rC/D scheduled for 6/16  Chronic hypertension in pregnancy Normotensive today. Remains on labetalol . Reactive NST today.   Supervision of other normal pregnancy, antepartum Feel well overall. Some dizziness with prolonged standing. Reviewed hydration status and normal physiology of pregnancy.  Reviewed kick counts and preterm labor warning signs. Instructed to call office or come to hospital with persistent headache, vision changes, regular contractions, leaking of fluid, decreased fetal movement or vaginal bleeding.    No orders of the defined types were placed in this encounter.  No follow-ups on file.   Future Appointments  Date Time Provider Department Center  10/18/2023 11:00 AM [facility] None  10/19/2023  9:00 AM Tobb, Kardie, DO [facility]    For next visit:  NST next week, ROB/NST in 2 weeks     Subjective   23 y.o. G3P1011 at [redacted]w[redacted]d presents for this follow-up prenatal visit.  Patient feeling well overall.  Patient reports: Movement: Present Contractions: Not present  Objective   Flow sheet Vitals: Pulse Rate: 96 BP: 124/71 Total weight gain: 29 lb 8 oz (13.4 kg)  SINDI BECKWORTH [birth year] [redacted]w[redacted]d  Fetus A Non-Stress Test Interpretation for 10/01/23  Indication: Chronic Hypertenstion  Fetal Heart Rate A Mode: External Baseline Rate (A): 145 bpm Variability: Moderate Accelerations: 15 x 15 Decelerations: None Multiple birth?: No  Uterine Activity Mode: Toco Contraction Frequency (min): occasional  Interpretation (Fetal Testing) Nonstress Test Interpretation: Reactive    General Appearance  No acute distress, well appearing, and well  nourished Pulmonary   Normal work of breathing Neurologic   Alert and oriented to person, place, and time Psychiatric   Mood and affect within normal limits  Donato Fu, CNM  05/23/253:44 PM

## 2023-10-01 NOTE — Progress Notes (Signed)
    NURSE VISIT NOTE  Subjective:    Patient ID: Joyce Smith, female    DOB: April 16, 2001, 23 y.o.   MRN: [id number]  HPI  Patient is a 23 y.o. G55P1011 female who presents for fetal monitoring per order from Billy Bue, MD.   Objective:    BP 124/71   Pulse 96   Wt (!) 301 lb 8 oz (136.8 kg)   LMP 01/30/2023 (Exact Date)   BMI 51.75 kg/m  Estimated Date of Delivery: 11/06/23  [redacted]w[redacted]d  Fetus A Non-Stress Test Interpretation for 10/01/23  Indication: Chronic Hypertenstion and Obesity  Fetal Heart Rate A Mode: External Baseline Rate (A): 145 bpm Variability: Moderate Accelerations: 15 x 15 Decelerations: None Multiple birth?: No  Uterine Activity Mode: Toco Contraction Frequency (min): occasional  Interpretation (Fetal Testing) Nonstress Test Interpretation: Reactive   Assessment:   1. Vaginal irritation   2. History of cesarean delivery   3. Chronic hypertension in pregnancy   4. Supervision of other normal pregnancy, antepartum      Plan:   Results reviewed and discussed with patient by  Donato Fu, CNM.     Donato Fu, CNM [facility]

## 2023-10-01 NOTE — Assessment & Plan Note (Signed)
 Feel well overall. Some dizziness with prolonged standing. Reviewed hydration status and normal physiology of pregnancy.  Reviewed kick counts and preterm labor warning signs. Instructed to call office or come to hospital with persistent headache, vision changes, regular contractions, leaking of fluid, decreased fetal movement or vaginal bleeding.

## 2023-10-01 NOTE — Assessment & Plan Note (Signed)
 Normotensive today. Remains on labetalol . Reactive NST today.

## 2023-10-01 NOTE — Assessment & Plan Note (Signed)
 rC/D scheduled for 6/16

## 2023-10-05 ENCOUNTER — Ambulatory Visit

## 2023-10-05 ENCOUNTER — Other Ambulatory Visit

## 2023-10-08 ENCOUNTER — Encounter: Payer: Self-pay | Admitting: Obstetrics and Gynecology

## 2023-10-08 ENCOUNTER — Observation Stay
Admission: EM | Admit: 2023-10-08 | Discharge: 2023-10-08 | Disposition: A | Discharge status: Home / Self Care | Attending: Obstetrics and Gynecology | Admitting: Obstetrics and Gynecology

## 2023-10-08 ENCOUNTER — Other Ambulatory Visit

## 2023-10-08 ENCOUNTER — Emergency Department

## 2023-10-08 ENCOUNTER — Other Ambulatory Visit: Payer: Self-pay

## 2023-10-08 DIAGNOSIS — R002 Palpitations: Secondary | ICD-10-CM | POA: Diagnosis not present

## 2023-10-08 DIAGNOSIS — O10013 Pre-existing essential hypertension complicating pregnancy, third trimester: Secondary | ICD-10-CM | POA: Diagnosis not present

## 2023-10-08 DIAGNOSIS — O99513 Diseases of the respiratory system complicating pregnancy, third trimester: Secondary | ICD-10-CM | POA: Diagnosis not present

## 2023-10-08 DIAGNOSIS — J45909 Unspecified asthma, uncomplicated: Secondary | ICD-10-CM | POA: Diagnosis not present

## 2023-10-08 DIAGNOSIS — O26893 Other specified pregnancy related conditions, third trimester: Principal | ICD-10-CM | POA: Insufficient documentation

## 2023-10-08 DIAGNOSIS — Z7982 Long term (current) use of aspirin: Secondary | ICD-10-CM | POA: Insufficient documentation

## 2023-10-08 DIAGNOSIS — Z3A36 36 weeks gestation of pregnancy: Secondary | ICD-10-CM

## 2023-10-08 DIAGNOSIS — Z3A35 35 weeks gestation of pregnancy: Secondary | ICD-10-CM | POA: Diagnosis not present

## 2023-10-08 DIAGNOSIS — Z3480 Encounter for supervision of other normal pregnancy, unspecified trimester: Secondary | ICD-10-CM

## 2023-10-08 DIAGNOSIS — Z79899 Other long term (current) drug therapy: Secondary | ICD-10-CM | POA: Insufficient documentation

## 2023-10-08 LAB — BASIC METABOLIC PANEL WITH GFR
Anion gap: 9 (ref 5–15)
BUN: 7 mg/dL (ref 6–20)
CO2: 22 mmol/L (ref 22–32)
Calcium: 8.5 mg/dL — ABNORMAL LOW (ref 8.9–10.3)
Chloride: 105 mmol/L (ref 98–111)
Creatinine, Ser: 0.55 mg/dL (ref 0.44–1.00)
GFR, Estimated: >60 mL/min (ref >60)
Glucose, Bld: 98 mg/dL (ref 70–99)
Potassium: 3.8 mmol/L (ref 3.5–5.1)
Sodium: 136 mmol/L (ref 135–145)

## 2023-10-08 LAB — URINALYSIS, ROUTINE W REFLEX MICROSCOPIC
Bilirubin Urine: NEGATIVE
Glucose, UA: NEGATIVE mg/dL
Hgb urine dipstick: NEGATIVE
Ketones, ur: NEGATIVE mg/dL
Leukocytes,Ua: NEGATIVE
Nitrite: NEGATIVE
Protein, ur: NEGATIVE mg/dL
Specific Gravity, Urine: 1.016 (ref 1.005–1.030)
pH: 6 (ref 5.0–8.0)

## 2023-10-08 LAB — TSH: TSH: 1.849 u[IU]/mL (ref 0.350–4.500)

## 2023-10-08 LAB — CBC
HCT: 32.9 % — ABNORMAL LOW (ref 36.0–46.0)
Hemoglobin: 10.5 g/dL — ABNORMAL LOW (ref 12.0–15.0)
MCH: 26.3 pg (ref 26.0–34.0)
MCHC: 31.9 g/dL (ref 30.0–36.0)
MCV: 82.3 fL (ref 80.0–100.0)
Platelets: 252 10*3/uL (ref 150–400)
RBC: 4 MIL/uL (ref 3.87–5.11)
RDW: 14.5 % (ref 11.5–15.5)
WBC: 12.6 10*3/uL — ABNORMAL HIGH (ref 4.0–10.5)
nRBC: 0 % (ref 0.0–0.2)

## 2023-10-08 LAB — TROPONIN I (HIGH SENSITIVITY): Troponin I (High Sensitivity): 2 ng/L (ref <18)

## 2023-10-08 NOTE — ED Triage Notes (Signed)
 Pt presents to the ED POV from home with palpitations. Pt is [redacted] weeks pregnant and states that these started early in pregnancy, but states that since Monday they have been more frequent and lasting longer.  FHR 156

## 2023-10-08 NOTE — ED Provider Notes (Signed)
 [facility] Emergency Department Provider Note     Event Date/Time   First MD Initiated Contact with Patient 10/08/23 1430     (approximate)   History   Palpitations   HPI  Joyce Smith is a 23 y.o. female G3P1 at [redacted] weeks gestation, presenting from home via POV.  Patient medical history includes patient would endorse palpitations abdomen present throughout the pregnancy.  She had her daily Labetalol  increased fro 100 mg BID  to 200 mg BID, in April. By her report, this current episode has been intermittent since Monday, with frequent episodes lasting longer than she has experienced in the past patient denies any syncope, chest pain, or shortness of breath.  She reports normal fetal movement and denies any pregnancy concerns at this time. She was scheduled for a routine weekly NST in the clinic today, but was advised to report to the ED for evaluation. She denies frank chest pain, N/V, dizziness, diaphoresis, or syncope with the episodes lasting 30-60 seconds.   Physical Exam   Triage Vital Signs: ED Triage Vitals  Encounter Vitals Group     BP 10/08/23 1346 135/67     Systolic BP Percentile --      Diastolic BP Percentile --      Pulse Rate 10/08/23 1346 (!) 112     Resp 10/08/23 1346 18     Temp 10/08/23 1346 98.6 F (37 C)     Temp Source 10/08/23 1346 Oral     SpO2 10/08/23 1346 99 %     Weight 10/08/23 1347 (!) 301 lb (136.5 kg)     Height 10/08/23 1347 5\' 4"  (1.626 m)     Head Circumference --      Peak Flow --      Pain Score 10/08/23 1346 0     Pain Loc --      Pain Education --      Exclude from Growth Chart --     Most recent vital signs: Vitals:   10/08/23 1346 10/08/23 1616  BP: 135/67 (!) 143/75  Pulse: (!) 112 88  Resp: 18   Temp: 98.6 F (37 C)   SpO2: 99% 100%    General Awake, no distress. NAD HEENT NCAT. PERRL. EOMI. No rhinorrhea. Mucous membranes are moist.  CV:  Good peripheral perfusion.  RRR RESP:  Normal effort. CTA ABD:  No distention. Gravid   ED Results / Procedures / Treatments   Labs (all labs ordered are listed, but only abnormal results are displayed) Labs Reviewed  BASIC METABOLIC PANEL WITH GFR - Abnormal; Notable for the following components:      Result Value   Calcium 8.5 (*)    All other components within normal limits  CBC - Abnormal; Notable for the following components:   WBC 12.6 (*)    Hemoglobin 10.5 (*)    HCT 32.9 (*)    All other components within normal limits  URINALYSIS, ROUTINE W REFLEX MICROSCOPIC - Abnormal; Notable for the following components:   Color, Urine YELLOW (*)    APPearance HAZY (*)    All other components within normal limits  TSH  TROPONIN I (HIGH SENSITIVITY)     EKG  Vent. rate 103 BPM PR interval 130 ms QRS duration 88 ms QT/QTcB 338/442 ms P-R-T axes 73 81 3 Sinus tachycardia Nonspecific T wave abnormality Abnormal ECG No STEMI  RADIOLOGY  I personally viewed and evaluated these images as part of my medical decision  making, as well as reviewing the written report by the radiologist.  ED Provider Interpretation:  no acute findings  DG Chest 2 View Result Date: 10/08/2023 CLINICAL DATA:  Palpitations.  Third trimester pregnancy. EXAM: CHEST - 2 VIEW COMPARISON:  None Available. FINDINGS: The heart size and mediastinal contours are within normal limits. Both lungs are clear. The visualized skeletal structures are unremarkable. IMPRESSION: No active cardiopulmonary disease. Electronically Signed   By: Marlyce Sine M.D.   On: 10/08/2023 16:45     PROCEDURES:  Critical Care performed: No  Procedures FHTs 140s by handheld doppler  MEDICATIONS ORDERED IN ED: Medications - No data to display   IMPRESSION / MDM / ASSESSMENT AND PLAN / ED COURSE  I reviewed the triage vital signs and the nursing notes.                              Differential diagnosis includes, but is not limited to, hypotension,  dehydration, menstrual abnormality, hyperthyroidism, sinus tachycardia, tach arrhythmia  Patient's presentation is most consistent with acute complicated illness / injury requiring diagnostic workup.  Patient's diagnosis is consistent with palpitations and [redacted] weeks gestation.  Patient with history of the same, presents to the ED in no acute distress at the advice of her nurse from the [facility] clinic prior to her weekly scheduled NST.  Patient presents in no acute distress, with reassuring Fama workup at this time.  No acute lab abnormalities are noted.  Vital signs are stable throughout her course in the ED with reassuring heart rates in the high 80s.  Blood pressures are normal throughout her course in the ED.  No pregnancy related complaints with fetal heart tones measured in 140s.    ----------------------------------------- 5:13 PM on 10/08/2023 -----------------------------------------  I spoke via telephone to the on-call CNM for the [facility], who recommends the patient report OB for patient will be discharged home with prescriptions for her scheduled NST which was not performed this morning.  Patient is to follow up with [facility] as discussed, as needed or otherwise directed. Patient is given ED precautions to return to the ED for any worsening or new symptoms.  Patient is discharged from the ED and escorted to [facility] observation as planned.    FINAL CLINICAL IMPRESSION(S) / ED DIAGNOSES   Final diagnoses:  Palpitations  [redacted] weeks gestation of pregnancy     Rx / DC Orders   ED Discharge Orders     None        Note:  This document was prepared using Dragon voice recognition software and may include unintentional dictation errors.    May Sparks, PA-C 10/08/23 1833    Bradler, Evan K, MD 10/09/23 1537

## 2023-10-08 NOTE — Discharge Instructions (Addendum)
 Follow-up with your [facility] provider as scheduled.

## 2023-10-08 NOTE — ED Notes (Signed)
 This RN spoke with L&D who stated they would see patient upstairs. Pt moved OTF and then taken to family wait. L&D then called back stating pt would need to be seen in ED. Pt taken to triage room at this time.

## 2023-10-08 NOTE — Progress Notes (Signed)
 Discharge instructions provided to pt. Pt verbalizes understanding. Vaginal bleeding and discharge, contractions, and fetal movement reviewed by RN.  Pt discharged home by self and understands to keep cardiologist and OB apt for follow-up.
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
## 2023-10-08 NOTE — OB Triage Note (Signed)
 The pt [facility] the [facility] for an [facility] after she missed her scheduled NST in the office today [facility]) due to ED visit. Pt has had heart palpitations since Monday and was cleared in the ED before arrival. She states that palpitations happen about 10x day and self resolve.  Pt states that she had positive fetal movement, no contractions, and no vaginal discharge or bleeding at this time.

## 2023-10-12 ENCOUNTER — Ambulatory Visit

## 2023-10-12 ENCOUNTER — Other Ambulatory Visit

## 2023-10-15 ENCOUNTER — Ambulatory Visit (INDEPENDENT_AMBULATORY_CARE_PROVIDER_SITE_OTHER): Admitting: Obstetrics

## 2023-10-15 ENCOUNTER — Other Ambulatory Visit (HOSPITAL_COMMUNITY)
Admission: RE | Admit: 2023-10-15 | Discharge: 2023-10-15 | Disposition: A | Discharge status: Home / Self Care | Source: Ambulatory Visit | Attending: Obstetrics | Admitting: Obstetrics

## 2023-10-15 ENCOUNTER — Encounter
Admission: RE | Admit: 2023-10-15 | Discharge: 2023-10-15 | Disposition: A | Discharge status: Home / Self Care | Source: Ambulatory Visit | Attending: Anesthesiology | Admitting: Anesthesiology

## 2023-10-15 ENCOUNTER — Ambulatory Visit

## 2023-10-15 VITALS — BP 128/81 | HR 102 | Wt 301.7 lb

## 2023-10-15 DIAGNOSIS — O10013 Pre-existing essential hypertension complicating pregnancy, third trimester: Secondary | ICD-10-CM

## 2023-10-15 DIAGNOSIS — O0993 Supervision of high risk pregnancy, unspecified, third trimester: Secondary | ICD-10-CM

## 2023-10-15 DIAGNOSIS — O34219 Maternal care for unspecified type scar from previous cesarean delivery: Secondary | ICD-10-CM

## 2023-10-15 DIAGNOSIS — Z98891 History of uterine scar from previous surgery: Secondary | ICD-10-CM

## 2023-10-15 DIAGNOSIS — O10919 Unspecified pre-existing hypertension complicating pregnancy, unspecified trimester: Secondary | ICD-10-CM

## 2023-10-15 DIAGNOSIS — O99213 Obesity complicating pregnancy, third trimester: Secondary | ICD-10-CM

## 2023-10-15 DIAGNOSIS — Z113 Encounter for screening for infections with a predominantly sexual mode of transmission: Secondary | ICD-10-CM | POA: Insufficient documentation

## 2023-10-15 DIAGNOSIS — Z3685 Encounter for antenatal screening for Streptococcus B: Secondary | ICD-10-CM

## 2023-10-15 DIAGNOSIS — Z3A36 36 weeks gestation of pregnancy: Secondary | ICD-10-CM

## 2023-10-15 DIAGNOSIS — O99210 Obesity complicating pregnancy, unspecified trimester: Secondary | ICD-10-CM

## 2023-10-15 DIAGNOSIS — E669 Obesity, unspecified: Secondary | ICD-10-CM | POA: Diagnosis not present

## 2023-10-15 NOTE — Patient Instructions (Signed)

## 2023-10-15 NOTE — Progress Notes (Addendum)
    NURSE VISIT NOTE  Subjective:    Patient ID: ELANORE TALCOTT, female    DOB: 2001/03/11, 23 y.o.   MRN: [id number]  HPI  Patient is a 23 y.o. G53P1011 female who presents for fetal monitoring per order from Sofia Dunn, MD.   Objective:    BP 128/81   Pulse (!) 102   Wt (!) 301 lb 11.2 oz (136.9 kg)   LMP 01/30/2023 (Exact Date)   BMI 51.79 kg/m  Estimated Date of Delivery: 11/06/23  [redacted]w[redacted]d  Fetus A Non-Stress Test Interpretation for 10/15/23  Indication: Chronic Hypertenstion and Obesity  Fetal Heart Rate A Mode: External Baseline Rate (A): 145 bpm Variability: Moderate Accelerations: 15 x 15 Decelerations: None Multiple birth?: No  Uterine Activity Mode: Toco  Interpretation (Fetal Testing) Nonstress Test Interpretation: Reactive Overall Impression: Reassuring for gestational age   Assessment:   1. Supervision of high risk pregnancy in third trimester   2. Chronic hypertension in pregnancy   3. Obesity in pregnancy      Plan:   Results reviewed and discussed with patient by  Sofia Dunn, MD.     Vale Garrison, CMA
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
## 2023-10-15 NOTE — Progress Notes (Signed)
    Return Prenatal Note   Subjective  23 y.o. G3P1011 at [redacted]w[redacted]d presents for this follow-up prenatal visit.   Patient has some vaginal pressure and has on a Zio monitor on now for heart palpations. c/section scheduled for 6/16 with [facility].  Patient reports: Movement: Present Contractions: Not present Denies vaginal bleeding or leaking fluid. Objective  Flow sheet Vitals: Pulse Rate: (!) 102 BP: 128/81 Fundal Height: 37 cm Fetal Heart Rate (bpm): 150 Total weight gain: 29 lb 11.2 oz (13.5 kg)  General Appearance  No acute distress, well appearing, and well nourished Pulmonary   Normal work of breathing Neurologic   Alert and oriented to person, place, and time Psychiatric   Mood and affect within normal limits  Fetus A Non-Stress Test Interpretation for 10/15/23  Indication: Chronic Hypertenstion  Fetal Heart Rate A Mode: External Baseline Rate (A): 150 bpm Variability: Moderate Accelerations: 15 x 15 Decelerations: None Multiple birth?: No     Interpretation (Fetal Testing) Nonstress Test Interpretation: Reactive  Assessment/Plan   Plan  23 y.o. G3P1011 at [redacted]w[redacted]d by LMP=7wk US  presents for follow-up OB visit. Reviewed prenatal record including previous visit note.  1. High-risk pregnancy, third trimester -GBS and GCCT swabs  obtained -Labor precautions reviewed  2. Chronic hypertension in pregnancy -BP [id number]/54; NST reactive -Continue Labetalol  200mg  BID per cardiology -Palpitations: wearing Zio monitor and has cards f/u 6/10 -33wk growth US  45%ile  3. Obesity, morbid, BMI 50 or higher (HCC) -Anesthesia consult done 4/15, recommended repeat around 36wks, will schedule -NST reactive  4. History of cesarean delivery -Repeat CD scheduled 6/16 w/Dr. Luster Salters   Orders Placed This Encounter  Procedures   Strep Gp B NAA   Return in about 1 week (around 10/22/2023) for ROB.   Future Appointments  Date Time Provider Department Center  10/18/2023 11:00 AM  [facility] None  10/19/2023  9:00 AM Tobb, Kardie, DO [facility]  10/22/2023  2:55 PM Free, Verita Glassman, CNM [facility] None  11/02/2023 10:35 AM Luster Salters Gretel Leaven, MD [facility] None    For next visit:  continue with routine prenatal care      Sofia Dunn, DO [facility]

## 2023-10-17 LAB — STREP GP B NAA: Strep Gp B NAA: POSITIVE — AB

## 2023-10-18 ENCOUNTER — Encounter
Admission: RE | Admit: 2023-10-18 | Discharge: 2023-10-18 | Disposition: A | Discharge status: Home / Self Care | Source: Ambulatory Visit | Attending: Obstetrics and Gynecology | Admitting: Obstetrics and Gynecology

## 2023-10-18 ENCOUNTER — Other Ambulatory Visit: Payer: Self-pay

## 2023-10-18 HISTORY — DX: Palpitations: R00.2

## 2023-10-18 HISTORY — DX: Morbid (severe) obesity due to excess calories: E66.01

## 2023-10-18 HISTORY — DX: Gastro-esophageal reflux disease without esophagitis: K21.9

## 2023-10-18 NOTE — Patient Instructions (Addendum)
 Your procedure is scheduled on:10-25-23 Monday  Arrival Time: 5:30 AM. Please call Labor and Delivery if your have any questions. [phone].  Arrival: If your arrival time is prior to 6:00 am, please enter through the Emergency Room Entrance and you will be directed to Labor and Delivery. If your arrival time is 6:00 am or later, please enter the Medical Mall and follow the greeter's instructions.  REMEMBER: Instructions that are not followed completely may result in serious medical risk, up to and including death; or upon the discretion of your surgeon and anesthesiologist your surgery may need to be rescheduled.  Do not eat food OR drink any liquids after midnight the night before surgery.  No gum chewing or hard candies.  One week prior to surgery:Stop NOW (10-18-23) Stop Anti-inflammatories (NSAIDS) such as Advil , Aleve , Ibuprofen , Motrin , Naproxen , Naprosyn  and Aspirin  based products such as Excedrin, Goody's Powder, BC Powder.  You may however, continue to take Tylenol  if needed for pain up until the day of surgery.  Continue taking all of your other prescription medications up until the day of surgery.  ON THE DAY OF SURGERY ONLY TAKE THESE MEDICATIONS WITH SIPS OF WATER: -labetalol  (NORMODYNE )   Continue your 162 mg Aspirin  up until the day prior to surgery-Do NOT take the morning of surgery  No Smoking including e-cigarettes for 24 hours prior to surgery.  No chewable tobacco products for at least 6 hours prior to surgery.  No nicotine  patches on the day of surgery.  Do not use any "recreational" drugs for at least a week prior to your surgery.  Please be advised that the combination of cocaine and anesthesia may have negative outcomes, up to and including death. If you test positive for cocaine, your surgery will be cancelled.  On the morning of surgery brush your teeth with toothpaste and water, you may rinse your mouth with mouthwash if you wish. Do not swallow any  toothpaste or mouthwash.  Use CHG soap as directed on instruction sheet (Avoid Nipple and Private Areas)  Do not wear jewelry, make-up, hairpins, clips or nail polish.  For welded (permanent) jewelry: bracelets, anklets, waist bands, etc.  Please have this removed prior to surgery.  If it is not removed, there is a chance that hospital personnel will need to cut it off on the day of surgery.  Do not wear lotions, powders, or perfumes.   Do not shave body hair from the neck down 48 hours before surgery.  Contact lenses, hearing aids and dentures may not be worn into surgery.  Do not bring valuables to the hospital. [facility] is not responsible for any missing/lost belongings or valuables.   Notify your doctor if there is any change in your medical condition (cold, fever, infection).  Wear comfortable clothing (specific to your surgery type) to the hospital.  After surgery, you can help prevent lung complications by doing breathing exercises.  Take deep breaths and cough every 1-2 hours. Your doctor may order a device called an Incentive Spirometer to help you take deep breaths. When coughing or sneezing, hold a pillow firmly against your incision with both hands. This is called "splinting." Doing this helps protect your incision. It also decreases belly discomfort.  Please call the Pre-admissions Testing Dept. at [phone] if you have any questions about these instructions.  Surgery Visitation Policy:  Visitor Passes   All visitors, including children, need an identification sticker when visiting. These stickers must be worn where they can be seen.  Labor & Delivery  Laboring women may have one designated support person and two other visitors of any age visit. The support person must remain the same. The visitors may switch with other visitors. Visitation is permitted 24 hours per day. The designated support person or a visitor over the age of 16 may sleep overnight in  the patient's room. A doula registered with [place] for labor and delivery support is not considered a visitor. Doulas not registered with [place] are considered visitors.  Mother Baby Unit, OB Specialty and Gynecological Care  A designated support person and three visitors of any age may visit. The three visitors may switch out. The designated support person or a visitor age 55 or older may stay overnight in the room. During the postpartum period (up to 6 weeks), if the mother is the patient, she can have her newborn stay with her if there is another support person present who can be responsible for the baby.      Preparing for Surgery with CHLORHEXIDINE  GLUCONATE (CHG) Soap  Chlorhexidine  Gluconate (CHG) Soap  o An antiseptic cleaner that kills germs and bonds with the skin to continue killing germs even after washing  o Used for showering the night before surgery and morning of surgery  Before surgery, you can play an important role by reducing the number of germs on your skin.  CHG (Chlorhexidine  gluconate) soap is an antiseptic cleanser which kills germs and bonds with the skin to continue killing germs even after washing.  Please do not use if you have an allergy to CHG or antibacterial soaps. If your skin becomes reddened/irritated stop using the CHG.  1. Shower the NIGHT BEFORE SURGERY and the MORNING OF SURGERY with CHG soap.  2. If you choose to wash your hair, wash your hair first as usual with your normal shampoo.  3. After shampooing, rinse your hair and body thoroughly to remove the shampoo.  4. Use CHG as you would any other liquid soap. You can apply CHG directly to the skin and wash gently with a scrungie or a clean washcloth.  5. Apply the CHG soap to your body only from the neck down. Do not use on open wounds or open sores. Avoid contact with your eyes, ears, mouth, and genitals (private parts). Wash face and genitals (private parts) with your normal  soap.  6. Wash thoroughly, paying special attention to the area where your surgery will be performed.  7. Thoroughly rinse your body with warm water.  8. Do not shower/wash with your normal soap after using and rinsing off the CHG soap.  9. Pat yourself dry with a clean towel.  10. Wear clean pajamas to bed the night before surgery.  12. Place clean sheets on your bed the night of your first shower and do not sleep with pets.  13. Shower again with the CHG soap on the day of surgery prior to arriving at the hospital.  14. Do not apply any deodorants/lotions/powders.  15. Please wear clean clothes to the hospital.

## 2023-10-19 ENCOUNTER — Ambulatory Visit: Attending: Cardiology | Admitting: Cardiology

## 2023-10-19 LAB — CERVICOVAGINAL ANCILLARY ONLY
Chlamydia: NEGATIVE
Comment: NEGATIVE
Comment: NORMAL
Neisseria Gonorrhea: NEGATIVE

## 2023-10-22 ENCOUNTER — Encounter
Admission: RE | Admit: 2023-10-22 | Discharge: 2023-10-22 | Disposition: A | Discharge status: Home / Self Care | Source: Ambulatory Visit | Attending: Obstetrics and Gynecology | Admitting: Obstetrics and Gynecology

## 2023-10-22 ENCOUNTER — Ambulatory Visit (INDEPENDENT_AMBULATORY_CARE_PROVIDER_SITE_OTHER)

## 2023-10-22 ENCOUNTER — Encounter: Payer: Self-pay | Admitting: Urgent Care

## 2023-10-22 VITALS — BP 124/92 | HR 112 | Wt 306.2 lb

## 2023-10-22 DIAGNOSIS — O34219 Maternal care for unspecified type scar from previous cesarean delivery: Secondary | ICD-10-CM | POA: Diagnosis not present

## 2023-10-22 DIAGNOSIS — Z3A00 Weeks of gestation of pregnancy not specified: Secondary | ICD-10-CM | POA: Insufficient documentation

## 2023-10-22 DIAGNOSIS — O10919 Unspecified pre-existing hypertension complicating pregnancy, unspecified trimester: Secondary | ICD-10-CM

## 2023-10-22 DIAGNOSIS — Z98891 History of uterine scar from previous surgery: Secondary | ICD-10-CM

## 2023-10-22 DIAGNOSIS — Z01812 Encounter for preprocedural laboratory examination: Secondary | ICD-10-CM | POA: Diagnosis present

## 2023-10-22 DIAGNOSIS — Z3480 Encounter for supervision of other normal pregnancy, unspecified trimester: Secondary | ICD-10-CM

## 2023-10-22 DIAGNOSIS — O10013 Pre-existing essential hypertension complicating pregnancy, third trimester: Secondary | ICD-10-CM | POA: Diagnosis not present

## 2023-10-22 DIAGNOSIS — Z3A37 37 weeks gestation of pregnancy: Secondary | ICD-10-CM | POA: Insufficient documentation

## 2023-10-22 DIAGNOSIS — O99213 Obesity complicating pregnancy, third trimester: Secondary | ICD-10-CM

## 2023-10-22 DIAGNOSIS — O0993 Supervision of high risk pregnancy, unspecified, third trimester: Secondary | ICD-10-CM | POA: Insufficient documentation

## 2023-10-22 DIAGNOSIS — O0992 Supervision of high risk pregnancy, unspecified, second trimester: Secondary | ICD-10-CM

## 2023-10-22 LAB — POCT URINALYSIS DIPSTICK OB
Bilirubin, UA: NEGATIVE
Blood, UA: NEGATIVE
Glucose, UA: NEGATIVE
Ketones, UA: NEGATIVE
Leukocytes, UA: NEGATIVE
Nitrite, UA: NEGATIVE
Spec Grav, UA: 1.01 (ref 1.010–1.025)
Urobilinogen, UA: 0.2 U/dL
pH, UA: 5 (ref 5.0–8.0)

## 2023-10-22 LAB — COMPREHENSIVE METABOLIC PANEL WITH GFR
ALT: 11 U/L (ref 0–44)
AST: 14 U/L — ABNORMAL LOW (ref 15–41)
Albumin: 2.8 g/dL — ABNORMAL LOW (ref 3.5–5.0)
Alkaline Phosphatase: 108 U/L (ref 38–126)
Anion gap: 11 (ref 5–15)
BUN: 7 mg/dL (ref 6–20)
CO2: 21 mmol/L — ABNORMAL LOW (ref 22–32)
Calcium: 8.8 mg/dL — ABNORMAL LOW (ref 8.9–10.3)
Chloride: 104 mmol/L (ref 98–111)
Creatinine, Ser: 0.56 mg/dL (ref 0.44–1.00)
GFR, Estimated: >60 mL/min (ref >60)
Glucose, Bld: 80 mg/dL (ref 70–99)
Potassium: 3.7 mmol/L (ref 3.5–5.1)
Sodium: 136 mmol/L (ref 135–145)
Total Bilirubin: 0.4 mg/dL (ref 0.0–1.2)
Total Protein: 7.2 g/dL (ref 6.5–8.1)

## 2023-10-22 LAB — CBC
HCT: 32.2 % — ABNORMAL LOW (ref 36.0–46.0)
Hemoglobin: 10.3 g/dL — ABNORMAL LOW (ref 12.0–15.0)
MCH: 25.9 pg — ABNORMAL LOW (ref 26.0–34.0)
MCHC: 32 g/dL (ref 30.0–36.0)
MCV: 80.9 fL (ref 80.0–100.0)
Platelets: 261 10*3/uL (ref 150–400)
RBC: 3.98 MIL/uL (ref 3.87–5.11)
RDW: 14.8 % (ref 11.5–15.5)
WBC: 12.2 10*3/uL — ABNORMAL HIGH (ref 4.0–10.5)
nRBC: 0 % (ref 0.0–0.2)

## 2023-10-22 LAB — TYPE AND SCREEN
ABO/RH(D): O POS
Antibody Screen: NEGATIVE
Extend sample reason: UNDETERMINED

## 2023-10-22 LAB — RAPID HIV SCREEN (HIV 1/2 AB+AG)
HIV 1/2 Antibodies: NONREACTIVE
HIV-1 P24 Antigen - HIV24: NONREACTIVE
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
## 2023-10-22 NOTE — Patient Instructions (Signed)
 Nonstress Test: What to Expect A nonstress test, also called an NST, is done during pregnancy to check your baby's heartbeat. The procedure can help to show if your baby is healthy. It may be done if: Your due date has passed. Your pregnancy is high risk. Your baby is moving less than normal. You've lost a previous pregnancy. Your baby is growing slowly. There's too much or too little fluid around your baby. The NST may be done in the third trimester to find out if it's best for your baby to be born early. During an NST, your baby's heartbeat is watched for at least 20 minutes. If the baby is healthy, the heart rate will go up when the baby moves and will return to normal when the baby rests. This should happen at least twice during the test. Tell a health care provider about: Any allergies you have. Any medical problems you have. All medicines you take. These include vitamins, herbs, eye drops, and creams. Any surgeries you've had. Any past pregnancies you've had. What are the risks? There are no risks to you or your baby from a nonstress test. This procedure shouldn't be painful or uncomfortable. What happens before? Eat a meal right before the test or as told by your health care team. Food may help the baby to move. Use the restroom right before the test. What happens during a nonstress test?  Two monitors will be placed on your belly. One will check your baby's heart rate, and the other will check for contractions. You may be asked to lie down on your side or to sit up. You may be given a button to press when you feel your baby move. If your baby seems to be sleeping, you may be asked to drink some juice or soda, eat a snack, or change positions. These steps may vary. Ask what you can expect. What can I expect after? Your team will talk with you about the results and tell you the next steps. If your team gave you any diet or activity instructions, make sure to follow them. Keep all  follow-up visits. This is important to check on your health and the health of your baby. This information is not intended to replace advice given to you by your health care provider. Make sure you discuss any questions you have with your health care provider. Document Revised: 04/22/2023 Document Reviewed: 04/22/2023 Elsevier Patient Education  2025 [employer].

## 2023-10-22 NOTE — Assessment & Plan Note (Addendum)
-   initial BP 140/82, repeat 125/92. CBC WNL from pre-op labs earlier today. Lab contacted to add on CMP. - UA neg for protein.

## 2023-10-22 NOTE — Assessment & Plan Note (Signed)
-   repeat CS on 6/16.

## 2023-10-23 LAB — RPR: RPR Ser Ql: NONREACTIVE

## 2023-10-25 ENCOUNTER — Other Ambulatory Visit: Payer: Self-pay

## 2023-10-25 ENCOUNTER — Inpatient Hospital Stay: Payer: Self-pay | Admitting: Certified Registered Nurse Anesthetist

## 2023-10-25 ENCOUNTER — Encounter: Payer: Self-pay | Admitting: Obstetrics and Gynecology

## 2023-10-25 ENCOUNTER — Encounter
Admission: RE | Disposition: A | Discharge status: Home / Self Care | Payer: Self-pay | Source: Home / Self Care | Attending: Obstetrics and Gynecology

## 2023-10-25 ENCOUNTER — Inpatient Hospital Stay
Admission: RE | Admit: 2023-10-25 | Discharge: 2023-10-27 | DRG: 787 | Disposition: A | Discharge status: Home / Self Care | Attending: Obstetrics and Gynecology | Admitting: Obstetrics and Gynecology

## 2023-10-25 DIAGNOSIS — O99214 Obesity complicating childbirth: Secondary | ICD-10-CM

## 2023-10-25 DIAGNOSIS — O34211 Maternal care for low transverse scar from previous cesarean delivery: Secondary | ICD-10-CM | POA: Diagnosis present

## 2023-10-25 DIAGNOSIS — Z3490 Encounter for supervision of normal pregnancy, unspecified, unspecified trimester: Principal | ICD-10-CM

## 2023-10-25 DIAGNOSIS — E669 Obesity, unspecified: Secondary | ICD-10-CM

## 2023-10-25 DIAGNOSIS — O1002 Pre-existing essential hypertension complicating childbirth: Secondary | ICD-10-CM | POA: Diagnosis not present

## 2023-10-25 DIAGNOSIS — Z3A38 38 weeks gestation of pregnancy: Secondary | ICD-10-CM | POA: Diagnosis not present

## 2023-10-25 DIAGNOSIS — Z7982 Long term (current) use of aspirin: Secondary | ICD-10-CM | POA: Diagnosis not present

## 2023-10-25 DIAGNOSIS — O1092 Unspecified pre-existing hypertension complicating childbirth: Secondary | ICD-10-CM | POA: Diagnosis present

## 2023-10-25 LAB — PROTEIN / CREATININE RATIO, URINE
Creatinine, Urine: 32 mg/dL
Total Protein, Urine: <6 mg/dL

## 2023-10-25 SURGERY — Surgical Case
Anesthesia: Spinal

## 2023-10-25 MED ORDER — ACETAMINOPHEN 500 MG PO TABS
1000.0000 mg | ORAL_TABLET | Freq: Four times a day (QID) | ORAL | Status: AC
Start: 2023-10-25 — End: 2023-10-26
  Administered 2023-10-25 – 2023-10-26 (×4): 1000 mg via ORAL
  Filled 2023-10-25 (×4): qty 2

## 2023-10-25 MED ORDER — LACTATED RINGERS IV SOLN: INTRAVENOUS | Status: DC

## 2023-10-25 MED ORDER — PHENYLEPHRINE HCL-NACL 20-0.9 MG/250ML-% IV SOLN
INTRAVENOUS | Status: AC
  Filled 2023-10-25: qty 250

## 2023-10-25 MED ORDER — MENTHOL 3 MG MT LOZG: 1.0000 | LOZENGE | OROMUCOSAL | Status: DC | PRN

## 2023-10-25 MED ORDER — PRENATAL MULTIVITAMIN CH
1.0000 | ORAL_TABLET | Freq: Every day | ORAL | Status: DC
  Administered 2023-10-25 – 2023-10-27 (×3): 1 via ORAL
  Filled 2023-10-25 (×3): qty 1

## 2023-10-25 MED ORDER — LACTATED RINGERS IV BOLUS
1000.0000 mL | Freq: Once | INTRAVENOUS | Status: AC
  Administered 2023-10-25: 1000 mL via INTRAVENOUS

## 2023-10-25 MED ORDER — SOD CITRATE-CITRIC ACID 500-334 MG/5ML PO SOLN
ORAL | Status: AC
  Administered 2023-10-25: 30 mL
  Filled 2023-10-25: qty 15

## 2023-10-25 MED ORDER — ZOLPIDEM TARTRATE 5 MG PO TABS: 5.0000 mg | ORAL_TABLET | Freq: Every evening | ORAL | Status: DC | PRN

## 2023-10-25 MED ORDER — LIDOCAINE 5 % EX PTCH
MEDICATED_PATCH | CUTANEOUS | Status: DC | PRN
  Administered 2023-10-25: 1 via TRANSDERMAL

## 2023-10-25 MED ORDER — OXYTOCIN-SODIUM CHLORIDE 30-0.9 UT/500ML-% IV SOLN: 2.5000 [IU]/h | INTRAVENOUS | Status: AC

## 2023-10-25 MED ORDER — ONDANSETRON HCL 4 MG/2ML IJ SOLN: INTRAMUSCULAR | Status: DC | PRN

## 2023-10-25 MED ORDER — NALOXONE HCL 4 MG/10ML IJ SOLN: 1.0000 ug/kg/h | INTRAVENOUS | Status: DC | PRN

## 2023-10-25 MED ORDER — SENNOSIDES-DOCUSATE SODIUM 8.6-50 MG PO TABS
2.0000 | ORAL_TABLET | ORAL | Status: DC
  Administered 2023-10-25 – 2023-10-27 (×3): 2 via ORAL
  Filled 2023-10-25 (×3): qty 2

## 2023-10-25 MED ORDER — OXYCODONE-ACETAMINOPHEN 5-325 MG PO TABS
1.0000 | ORAL_TABLET | ORAL | Status: DC | PRN
  Administered 2023-10-26 – 2023-10-27 (×4): 1 via ORAL
  Filled 2023-10-25: qty 1
  Filled 2023-10-25: qty 2
  Filled 2023-10-25: qty 1
  Filled 2023-10-25: qty 2
  Filled 2023-10-25: qty 1

## 2023-10-25 MED ORDER — KETOROLAC TROMETHAMINE 30 MG/ML IJ SOLN: INTRAMUSCULAR | Status: DC | PRN

## 2023-10-25 MED ORDER — DIPHENHYDRAMINE HCL 50 MG/ML IJ SOLN: 12.5000 mg | INTRAMUSCULAR | Status: DC | PRN

## 2023-10-25 MED ORDER — ONDANSETRON HCL 4 MG/2ML IJ SOLN: 4.0000 mg | Freq: Three times a day (TID) | INTRAMUSCULAR | Status: DC | PRN

## 2023-10-25 MED ORDER — OXYTOCIN-SODIUM CHLORIDE 30-0.9 UT/500ML-% IV SOLN: INTRAVENOUS | Status: DC | PRN

## 2023-10-25 MED ORDER — LACTATED RINGERS IV SOLN: Freq: Once | INTRAVENOUS | Status: DC

## 2023-10-25 MED ORDER — LIDOCAINE HCL (PF) 1 % IJ SOLN
INTRAMUSCULAR | Status: DC | PRN
Start: 2023-10-25 — End: 2023-10-25

## 2023-10-25 MED ORDER — DIPHENHYDRAMINE HCL 25 MG PO CAPS: 25.0000 mg | ORAL_CAPSULE | ORAL | Status: DC | PRN

## 2023-10-25 MED ORDER — SODIUM CHLORIDE 0.9% FLUSH: 3.0000 mL | INTRAVENOUS | Status: DC | PRN

## 2023-10-25 MED ORDER — ORAL CARE MOUTH RINSE: 15.0000 mL | Freq: Once | OROMUCOSAL | Status: AC

## 2023-10-25 MED ORDER — SCOPOLAMINE 1 MG/3DAYS TD PT72: 1.0000 | MEDICATED_PATCH | Freq: Once | TRANSDERMAL | Status: DC

## 2023-10-25 MED ORDER — NALOXONE HCL 0.4 MG/ML IJ SOLN: 0.4000 mg | INTRAMUSCULAR | Status: DC | PRN

## 2023-10-25 MED ORDER — DIPHENHYDRAMINE HCL 25 MG PO CAPS: 25.0000 mg | ORAL_CAPSULE | Freq: Four times a day (QID) | ORAL | Status: DC | PRN

## 2023-10-25 MED ORDER — CEFAZOLIN SODIUM-DEXTROSE 3-4 GM/150ML-% IV SOLN
3.0000 g | INTRAVENOUS | Status: AC
  Filled 2023-10-25: qty 150

## 2023-10-25 MED ORDER — FENTANYL CITRATE (PF) 100 MCG/2ML IJ SOLN
INTRAMUSCULAR | Status: AC
  Filled 2023-10-25: qty 2

## 2023-10-25 MED ORDER — OXYCODONE HCL 5 MG PO TABS: 5.0000 mg | ORAL_TABLET | Freq: Four times a day (QID) | ORAL | Status: DC | PRN

## 2023-10-25 MED ORDER — 0.9 % SODIUM CHLORIDE (POUR BTL) OPTIME
TOPICAL | Status: DC | PRN
Start: 2023-10-25 — End: 2023-10-25
  Administered 2023-10-25: 1000 mL

## 2023-10-25 MED ORDER — IBUPROFEN 600 MG PO TABS
600.0000 mg | ORAL_TABLET | Freq: Four times a day (QID) | ORAL | Status: DC
  Administered 2023-10-26 – 2023-10-27 (×7): 600 mg via ORAL
  Filled 2023-10-25 (×7): qty 1

## 2023-10-25 MED ORDER — MEPERIDINE HCL 25 MG/ML IJ SOLN: 6.2500 mg | INTRAMUSCULAR | Status: DC | PRN

## 2023-10-25 MED ORDER — LABETALOL HCL 200 MG PO TABS
200.0000 mg | ORAL_TABLET | Freq: Two times a day (BID) | ORAL | Status: DC
  Administered 2023-10-25 – 2023-10-27 (×4): 200 mg via ORAL
  Filled 2023-10-25 (×5): qty 1

## 2023-10-25 MED ORDER — DEXAMETHASONE SODIUM PHOSPHATE 10 MG/ML IJ SOLN: INTRAMUSCULAR | Status: DC | PRN

## 2023-10-25 MED ORDER — FENTANYL CITRATE (PF) 100 MCG/2ML IJ SOLN: INTRAMUSCULAR | Status: DC | PRN

## 2023-10-25 MED ORDER — OXYTOCIN-SODIUM CHLORIDE 30-0.9 UT/500ML-% IV SOLN
INTRAVENOUS | Status: AC
  Filled 2023-10-25: qty 500

## 2023-10-25 MED ORDER — DEXAMETHASONE SODIUM PHOSPHATE 10 MG/ML IJ SOLN
INTRAMUSCULAR | Status: AC
  Filled 2023-10-25: qty 1

## 2023-10-25 MED ORDER — KETOROLAC TROMETHAMINE 30 MG/ML IJ SOLN: 30.0000 mg | Freq: Four times a day (QID) | INTRAMUSCULAR | Status: AC | PRN

## 2023-10-25 MED ORDER — ONDANSETRON HCL 4 MG/2ML IJ SOLN
INTRAMUSCULAR | Status: AC
  Filled 2023-10-25: qty 2

## 2023-10-25 MED ORDER — PHENYLEPHRINE 80 MCG/ML (10ML) SYRINGE FOR IV PUSH (FOR BLOOD PRESSURE SUPPORT): PREFILLED_SYRINGE | INTRAVENOUS | Status: DC | PRN

## 2023-10-25 MED ORDER — KETOROLAC TROMETHAMINE 30 MG/ML IJ SOLN
30.0000 mg | Freq: Four times a day (QID) | INTRAMUSCULAR | Status: AC | PRN
  Administered 2023-10-25 (×2): 30 mg via INTRAVENOUS
  Filled 2023-10-25 (×2): qty 1

## 2023-10-25 MED ORDER — MORPHINE SULFATE (PF) 0.5 MG/ML IJ SOLN: INTRAMUSCULAR | Status: DC | PRN

## 2023-10-25 MED ORDER — SIMETHICONE 80 MG PO CHEW
80.0000 mg | CHEWABLE_TABLET | Freq: Four times a day (QID) | ORAL | Status: DC
  Administered 2023-10-25 – 2023-10-27 (×9): 80 mg via ORAL
  Filled 2023-10-25 (×9): qty 1

## 2023-10-25 MED ORDER — PHENYLEPHRINE HCL-NACL 20-0.9 MG/250ML-% IV SOLN: INTRAVENOUS | Status: DC | PRN

## 2023-10-25 MED ORDER — LIDOCAINE 5 % EX PTCH
MEDICATED_PATCH | CUTANEOUS | Status: AC
  Filled 2023-10-25: qty 1

## 2023-10-25 MED ORDER — CHLORHEXIDINE GLUCONATE 0.12 % MT SOLN
15.0000 mL | Freq: Once | OROMUCOSAL | Status: AC
  Administered 2023-10-25: 15 mL via OROMUCOSAL
  Filled 2023-10-25: qty 15

## 2023-10-25 MED ORDER — MORPHINE SULFATE (PF) 0.5 MG/ML IJ SOLN
INTRAMUSCULAR | Status: AC
  Filled 2023-10-25: qty 10

## 2023-10-25 MED ORDER — POVIDONE-IODINE 10 % EX SWAB
2.0000 | Freq: Once | CUTANEOUS | Status: AC
  Administered 2023-10-25: 2 via TOPICAL

## 2023-10-25 MED ORDER — BUPIVACAINE IN DEXTROSE 0.75-8.25 % IT SOLN: INTRATHECAL | Status: DC | PRN

## 2023-10-25 SURGICAL SUPPLY — 26 items
ADHESIVE MASTISOL STRL (MISCELLANEOUS) ×1 IMPLANT
BAG COUNTER SPONGE SURGICOUNT (BAG) ×1 IMPLANT
BENZOIN TINCTURE PRP APPL 2/3 (GAUZE/BANDAGES/DRESSINGS) IMPLANT
CHLORAPREP W/TINT 26 (MISCELLANEOUS) ×2 IMPLANT
DRESSING TELFA 8X10 (GAUZE/BANDAGES/DRESSINGS) IMPLANT
DRSG TELFA 3X8 NADH STRL (GAUZE/BANDAGES/DRESSINGS) ×1 IMPLANT
GAUZE SPONGE 4X4 12PLY STRL (GAUZE/BANDAGES/DRESSINGS) ×1 IMPLANT
GAUZE SPONGE 4X4 12PLY STRL LF (GAUZE/BANDAGES/DRESSINGS) IMPLANT
GLOVE PI ORTHO PRO STRL 7.5 (GLOVE) ×1 IMPLANT
GOWN STRL REUS W/ TWL LRG LVL3 (GOWN DISPOSABLE) ×2 IMPLANT
KIT TURNOVER KIT A (KITS) ×1 IMPLANT
MANIFOLD NEPTUNE II (INSTRUMENTS) ×1 IMPLANT
MAT PREVALON FULL STRYKER (MISCELLANEOUS) ×1 IMPLANT
NS IRRIG 1000ML POUR BTL (IV SOLUTION) ×1 IMPLANT
PACK C SECTION AR (MISCELLANEOUS) ×1 IMPLANT
PAD OB MATERNITY 11 LF (PERSONAL CARE ITEMS) ×1 IMPLANT
PAD PREP OB/GYN DISP 24X41 (PERSONAL CARE ITEMS) ×1 IMPLANT
RETRACTOR TRAXI PANNICULUS (MISCELLANEOUS) IMPLANT
RETRACTOR WND ALEXIS-O 25 LRG (MISCELLANEOUS) ×1 IMPLANT
SCRUB CHG 4% DYNA-HEX 4OZ (MISCELLANEOUS) ×1 IMPLANT
SPONGE T-LAP 18X18 ~~LOC~~+RFID (SPONGE) ×1 IMPLANT
SUT VIC AB 0 CTX36XBRD ANBCTRL (SUTURE) ×2 IMPLANT
SUT VIC AB 1 CT1 36 (SUTURE) ×2 IMPLANT
SUT VICRYL+ 3-0 36IN CT-1 (SUTURE) ×2 IMPLANT
TRAP FLUID SMOKE EVACUATOR (MISCELLANEOUS) ×1 IMPLANT
WATER STERILE IRR 500ML POUR (IV SOLUTION) ×1 IMPLANT

## 2023-10-25 NOTE — Transfer of Care (Signed)
 Immediate Anesthesia Transfer of Care Note  Patient: Joyce Smith  Procedure(s) Performed: CESAREAN DELIVERY  Patient Location: Mother/Baby  Anesthesia Type:Spinal  Level of Consciousness: awake, alert , and oriented  Airway & Oxygen Therapy: Patient Spontanous Breathing  Post-op Assessment: Report given to RN and Post -op Vital signs reviewed and stable  Post vital signs: Reviewed and stable  Last Vitals:  Vitals Value Taken Time  BP 135/72   Temp    Pulse 80   Resp 15   SpO2 100     Last Pain:  Vitals:   10/25/23 0626  TempSrc:   PainSc: 0-No pain         Complications: No notable events documented.

## 2023-10-25 NOTE — Anesthesia Procedure Notes (Signed)
 Spinal  Patient location during procedure: OR Start time: 10/25/2023 7:45 AM End time: 10/25/2023 7:50 AM Reason for block: surgical anesthesia Staffing Performed: anesthesiologist and resident/CRNA  Anesthesiologist: Enrique Harvest, MD Resident/CRNA: Buell Carmin, CRNA Performed by: Darlen Eglin, Leonarda Leis, CRNA Authorized by: Enrique Harvest, MD   Preanesthetic Checklist Completed: patient identified, IV checked, site marked, risks and benefits discussed, surgical consent, monitors and equipment checked, pre-op evaluation and timeout performed Spinal Block Patient position: sitting Prep: ChloraPrep Patient monitoring: heart rate, continuous pulse ox and blood pressure Approach: midline Location: L3-4 Injection technique: single-shot Needle Needle type: Pencan  Needle gauge: 24 G Needle length: 10 cm Assessment Events: CSF return Additional Notes CRNA attempted X 1, MD X 1.
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
## 2023-10-25 NOTE — Op Note (Signed)
     OP NOTE  Date: 10/25/2023   8:58 AM Name Joyce Smith MR# [id number]  Preoperative Diagnosis: 1. Intrauterine pregnancy at [redacted]w[redacted]d Principal Problem:   Pregnancy  2.  Prior CD 3. Chronic HTN  Postoperative Diagnosis: 1. Intrauterine pregnancy at [redacted]w[redacted]d, delivered 2. Viable infant 3. Remainder same as pre-op   Procedure: 1. Repeat Low-Transverse Cesarean Section  Surgeon: Delice Felt, MD  Assistant:  Quincy Buba, CRNFA  Anesthesia: Spinal   EBL: 595 ml     Findings: 1) female infant, Apgar scores of    at 1 minute and    at 5 minutes and a birthweight of   ounces.    2) Normal uterus, tubes and ovaries.    Procedure:  The patient was prepped and draped in the supine position and placed under spinal anesthesia.  A transverse incision was made across the abdomen in a Pfannenstiel manner. If indicated the old scar was systematically removed with sharp dissection.  We carried the dissection down to the level of the fascia.  The fascia was incised in a curvilinear manner.  The fascia was then elevated from the rectus muscles with blunt and sharp dissection.  The rectus muscles were separated laterally exposing the peritoneum.  The peritoneum was carefully entered with care being taken to avoid bowel and bladder.  A self-retaining retractor was placed.  The visceral peritoneum was incised in a curvilinear fashion across the lower uterine segment creating a bladder flap. A transverse incision was made across the lower uterine segment and extended laterally and superiorly with blunt dissection.  Artificial rupture membranes was performed and Clear fluid was noted.  The infant was delivered from the cephalic position.  A nuchal cord was not present. After an appropriate time interval, the cord was doubly clamped and cut. Cord blood was obtained if required.  The infant was handed to the pediatric personnel  who then placed the infant under heat lamps where it was cleaned dried and  suctioned as needed. The placenta was delivered. The hysterotomy incision was then identified on ring forceps.  The uterine cavity was cleaned with a moist lap sponge.  The hysterotomy incision was closed with a running interlocking suture of Vicryl.  Hemostasis was excellent.  Pitocin  was run in the IV and the uterus was found to be firm. The posterior cul-de-sac and gutters were cleaned and inspected.  Hemostasis was noted.  The fascia was then closed with a running suture of #1 Vicryl.  Hemostasis of the subcutaneous tissues was obtained using the Bovie.  The subcutaneous tissues were closed with a running suture of 000 Vicryl.  A subcuticular suture was placed.  Steri-strips were applied in the usual manner.  A Lidoderm  patch was applied.  A pressure dressing was placed.  The patient went to the recovery room in stable condition. [facility] CRNFA  provided exposure, dissection, suctioning, retraction, and general support and assistance during the procedure.   Delice Felt, M.D. 10/25/2023 8:58 AM

## 2023-10-26 ENCOUNTER — Encounter: Payer: Self-pay | Admitting: Obstetrics and Gynecology

## 2023-10-26 LAB — CBC
HCT: 27.5 % — ABNORMAL LOW (ref 36.0–46.0)
Hemoglobin: 8.7 g/dL — ABNORMAL LOW (ref 12.0–15.0)
MCH: 25.5 pg — ABNORMAL LOW (ref 26.0–34.0)
MCHC: 31.6 g/dL (ref 30.0–36.0)
MCV: 80.6 fL (ref 80.0–100.0)
Platelets: 220 10*3/uL (ref 150–400)
RBC: 3.41 MIL/uL — ABNORMAL LOW (ref 3.87–5.11)
RDW: 14.8 % (ref 11.5–15.5)
WBC: 11.8 10*3/uL — ABNORMAL HIGH (ref 4.0–10.5)
nRBC: 0 % (ref 0.0–0.2)

## 2023-10-26 NOTE — Progress Notes (Signed)
 Progress Note - Cesarean Delivery  Joyce Smith is a 23 y.o. [id number] now PP day 1 s/p C-Section, Low Transverse.   Subjective:  Patient reports no problems with eating, bowel movements, voiding, or their wound  Bottlefeeding    Objective:  Vital signs in last 24 hours: Temp:  [97.4 F (36.3 C)-98.4 F (36.9 C)] 98.4 F (36.9 C) (06/17 0303) Pulse Rate:  [65-98] 73 (06/17 0500) Resp:  [11-27] 18 (06/17 0303) BP: (106-154)/(56-86) 136/82 (06/17 0303) SpO2:  [96 %-100 %] 96 % (06/17 0500)  Physical Exam:  General: alert, cooperative, and no distress Lochia: appropriate Uterine Fundus: firm Incision: Dressing intact dry    Data Review Recent Labs    10/26/23 0519  HGB 8.7*  HCT 27.5*    Assessment:  Principal Problem:   Pregnancy   Status post Cesarean section. Doing well postoperatively.     Plan:       Continue current care.  OOB  May shower  Dressing change today  Follow Bps  Prob discharge tomorrow  Delice Felt, M.D. 10/26/2023 7:09 AM

## 2023-10-26 NOTE — Anesthesia Postprocedure Evaluation (Signed)
 Anesthesia Post Note  Patient: Joyce Smith  Procedure(s) Performed: CESAREAN DELIVERY  Patient location during evaluation: Mother Baby Anesthesia Type: Spinal Level of consciousness: oriented and awake and alert Pain management: pain level controlled Vital Signs Assessment: post-procedure vital signs reviewed and stable Respiratory status: spontaneous breathing and respiratory function stable Cardiovascular status: blood pressure returned to baseline and stable Postop Assessment: no headache, no backache, no apparent nausea or vomiting and able to ambulate Anesthetic complications: no  No notable events documented.   Last Vitals:  Vitals:   10/26/23 0303 10/26/23 0500  BP: 136/82   Pulse: 82 73  Resp: 18   Temp: 36.9 C   SpO2: 100% 96%    Last Pain:  Vitals:   10/26/23 0303  TempSrc: Oral  PainSc:                  Sabas Cradle

## 2023-10-27 MED ORDER — OXYCODONE-ACETAMINOPHEN 5-325 MG PO TABS: 1.0000 | ORAL_TABLET | ORAL | 0 refills | Status: DC | PRN

## 2023-10-27 NOTE — Discharge Instructions (Signed)
   Discharge instructions:   Call office if you have any of the following:  headache, visual changes, fever >101.0 F, chills, breast concerns (engorgement, mastitis) excessive vaginal bleeding, incision drainage or problems, leg pain or redness, depression or any other concerns.   Activity: Do not lift > 10 lbs for 6 weeks.  No intercourse or tampons for 6 weeks.  No driving for 1-2 weeks or while taking pain medication. No strenuous activity or heavy lifting for 6 weeks.  No swimming pools, hot tubs or tub baths- showers only.    It is normal to bleed for up to 6 weeks. You should not soak through more than 1 pad in 1 hour.   Continue prenatal vitamin. Increase calories and fluids while breastfeeding.  Your milk will come in, in the next couple of days (right now it is colostrum).  You may have a slight fever when your milk comes in, but it should go away on its own.   If it does not, and rises above 101 F please call the doctor.  You will also feel achy and your breasts will be firm. They will also start to leak.  If you are breastfeeding, continue as you have been and you can pump/express milk for comfort.   For concerns about your baby, please call your pediatrician For breastfeeding concerns, the lactation consultant can be reached at [phone]  Postpartum blues (feelings of happy one minute and sad another minute) are normal for the first few weeks but if it gets worse let your doctor know.
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
## 2023-10-27 NOTE — Discharge Summary (Signed)
 Postpartum Discharge Summary  Date of Service updated 10/27/2023     Patient Name: Joyce Smith DOB: [birth year] MRN: [id number]  Date of admission: 10/25/2023 Delivery date:10/25/2023 Delivering provider: Zenobia Hila Date of discharge: 10/27/2023  Admitting diagnosis: Chronic hypertension in pregnancy [O10.919] History of cesarean delivery [Z98.891] Pregnancy [Z34.90] Intrauterine pregnancy: [redacted]w[redacted]d     Secondary diagnosis:  Principal Problem:   Pregnancy  Additional problems: none    Discharge diagnosis: Term Pregnancy Delivered and CHTN                                              Post partum procedures:none Augmentation: N/A Complications: None  Hospital course: Sceduled C/S   23 y.o. yo [id number] at [redacted]w[redacted]d was admitted to the hospital 10/25/2023 for scheduled cesarean section with the following indication:Elective Repeat.Delivery details are as follows:  Membrane Rupture Time/Date: 8:26 AM,10/25/2023  Delivery Method:C-Section, Low Transverse Operative Delivery:N/A Details of operation can be found in separate operative note.  Patient had a postpartum course uncomplicated.  She is ambulating, tolerating a regular diet, passing flatus, and urinating well. Patient is discharged home in stable condition on  10/27/23        Newborn Data: Birth date:10/25/2023 Birth time:8:27 AM Gender:Female Living status:Living Apgars:8 ,8  Weight:3310 g    Magnesium  Sulfate received: No BMZ received: No Rhophylac:N/A MMR:N/A T-DaP:Given prenatally Flu: N/A RSV Vaccine received: No Transfusion:No Immunizations administered: Immunization History  Administered Date(s) Administered   DTaP 11/28/2001, 01/19/2002, 04/05/2002, 04/24/2003, 04/13/2006   Dtap, Unspecified 04/13/2006   H1N1 03/28/2008   HIB (PRP-OMP) 11/28/2001, 01/19/2002, 04/05/2002, 10/10/2002   HIB, Unspecified 11/28/2001, 01/19/2002, 04/05/2002, 10/10/2002   Hep A, Unspecified 07/23/2005, 04/13/2006   Hep B,  Unspecified 10-01-2001, 10/28/2001, 04/24/2003   Hepatitis A, Adult 07/23/2005, 04/13/2006   Hepatitis B, ADULT 30-Oct-2001, 10/28/2001, 04/24/2003   IPV 11/28/2001, 01/19/2002, 04/24/2003, 04/13/2006   Influenza, Seasonal, Injecte, Preservative Fre 04/13/2006, 05/18/2007, 03/28/2008   Influenza,Quad,Nasal, Live 01/19/2013   Influenza,inj,Quad PF,6+ Mos 03/08/2017, 03/03/2022   Influenza-Unspecified 04/13/2006, 05/18/2007, 03/28/2008, 03/11/2022   MMR 10/10/2002, 04/13/2006   Meningococcal Conjugate 01/19/2013   Pneumococcal Conjugate PCV 7 11/28/2001, 01/19/2002, 04/05/2002, 04/24/2003   Pneumococcal-Unspecified 11/28/2001, 01/19/2002, 04/05/2002, 04/24/2003   Polio, Unspecified 04/13/2006   Tdap 01/19/2013, 02/03/2022, 09/02/2023   Varicella 10/10/2002, 04/13/2006, 04/25/2022    Physical exam  Vitals:   10/26/23 2125 10/26/23 2229 10/27/23 0330 10/27/23 0908  BP: (!) 140/83 (!) 145/80 (!) 133/54 130/74  Pulse: 85 96 88 91  Resp:  18 20   Temp:  98.3 F (36.8 C) 98.3 F (36.8 C) 98.3 F (36.8 C)  TempSrc:  Oral  Oral  SpO2:  100% 100% 99%  Weight:      Height:       General: alert, cooperative, and no distress Lochia: appropriate Uterine Fundus: firm Incision: Healing well with no significant drainage, Dressing is clean, dry, and intact DVT Evaluation: No evidence of DVT seen on physical exam. Negative Homan's sign. No cords or calf tenderness. Labs: Lab Results  Component Value Date   WBC 11.8 (H) 10/26/2023   HGB 8.7 (L) 10/26/2023   HCT 27.5 (L) 10/26/2023   MCV 80.6 10/26/2023   PLT 220 10/26/2023      Latest Ref Rng & Units 10/22/2023    2:50 PM  CMP  Glucose 70 - 99 mg/dL 80  BUN 6 - 20 mg/dL 7   Creatinine 1.61 - 0.96 mg/dL 0.45   Sodium 409 - 811 mmol/L 136   Potassium 3.5 - 5.1 mmol/L 3.7   Chloride 98 - 111 mmol/L 104   CO2 22 - 32 mmol/L 21   Calcium 8.9 - 10.3 mg/dL 8.8   Total Protein 6.5 - 8.1 g/dL 7.2   Total Bilirubin 0.0 - 1.2 mg/dL 0.4    Alkaline Phos 38 - 126 U/L 108   AST 15 - 41 U/L 14   ALT 0 - 44 U/L 11    Edinburgh Score:    10/26/2023    9:05 AM  Edinburgh Postnatal Depression Scale Screening Tool  I have been able to laugh and see the funny side of things. 0  I have looked forward with enjoyment to things. 0  I have blamed myself unnecessarily when things went wrong. 1  I have been anxious or worried for no good reason. 2  I have felt scared or panicky for no good reason. 0  Things have been getting on top of me. 2  I have been so unhappy that I have had difficulty sleeping. 0  I have felt sad or miserable. 1  I have been so unhappy that I have been crying. 1  The thought of harming myself has occurred to me. 0  Edinburgh Postnatal Depression Scale Total 7      After visit meds:  Allergies as of 10/27/2023   No Known Allergies      Medication List     STOP taking these medications    aspirin  EC 81 MG tablet       TAKE these medications    labetalol  200 MG tablet Commonly known as: NORMODYNE  Take 1 tablet (200 mg total) by mouth 2 (two) times daily.   Magnesium  250 MG Tabs Take 1 tablet by mouth daily at 6 (six) AM.   oxyCODONE -acetaminophen  5-325 MG tablet Commonly known as: PERCOCET/ROXICET Take 1-2 tablets by mouth every 4 (four) hours as needed for moderate pain (pain score 4-6).   prenatal vitamin w/FE, FA 29-1 MG Chew chewable tablet Chew 2 tablets by mouth daily at 12 noon.   PROBIOTIC PO Take 1 tablet by mouth daily.   Symbicort  80-4.5 MCG/ACT inhaler Generic drug: budesonide -formoterol  INHALE 2 PUFFS BY MOUTH INTO THE LUNGS DAILY What changed: See the new instructions.         Discharge home in stable condition Infant Feeding: Bottle Infant Disposition:home with mother Discharge instruction: per After Visit Summary and Postpartum booklet. Activity: Advance as tolerated. Pelvic rest for 6 weeks.  Diet: routine diet Anticipated Birth Control: natural family  planning Postpartum Appointment:1 week Additional Postpartum F/U: Incision check 1 week Future Appointments: Future Appointments  Date Time Provider Department Center  11/02/2023 10:35 AM Zenobia Hila, MD [facility] None   Follow up Visit:  Follow-up Information     Zenobia Hila, MD Follow up in 1 week(s).   Specialties: Obstetrics and Gynecology, Radiology Why: Incision check Contact information: [address] [place] [address] [phone]         [facility] at [facility] Follow up in 6 week(s).   Specialty: Obstetrics and Gynecology Why: Postpartum visit Contact information: [address] [place]  [address] [phone]                    10/27/2023 Alise Appl, CNM

## 2023-10-28 ENCOUNTER — Encounter: Payer: Self-pay | Admitting: Emergency Medicine

## 2023-10-28 ENCOUNTER — Telehealth: Payer: Self-pay

## 2023-10-28 ENCOUNTER — Observation Stay
Admission: EM | Admit: 2023-10-28 | Discharge: 2023-10-28 | Disposition: A | Discharge status: Home / Self Care | Attending: Obstetrics and Gynecology | Admitting: Obstetrics and Gynecology

## 2023-10-28 ENCOUNTER — Other Ambulatory Visit: Payer: Self-pay

## 2023-10-28 DIAGNOSIS — O9089 Other complications of the puerperium, not elsewhere classified: Secondary | ICD-10-CM | POA: Diagnosis not present

## 2023-10-28 DIAGNOSIS — R519 Headache, unspecified: Secondary | ICD-10-CM | POA: Insufficient documentation

## 2023-10-28 DIAGNOSIS — Z3A38 38 weeks gestation of pregnancy: Secondary | ICD-10-CM | POA: Diagnosis not present

## 2023-10-28 DIAGNOSIS — J45909 Unspecified asthma, uncomplicated: Secondary | ICD-10-CM | POA: Diagnosis not present

## 2023-10-28 DIAGNOSIS — O26893 Other specified pregnancy related conditions, third trimester: Principal | ICD-10-CM | POA: Insufficient documentation

## 2023-10-28 DIAGNOSIS — Z98891 History of uterine scar from previous surgery: Secondary | ICD-10-CM

## 2023-10-28 DIAGNOSIS — O99513 Diseases of the respiratory system complicating pregnancy, third trimester: Secondary | ICD-10-CM | POA: Insufficient documentation

## 2023-10-28 DIAGNOSIS — O10013 Pre-existing essential hypertension complicating pregnancy, third trimester: Secondary | ICD-10-CM | POA: Insufficient documentation

## 2023-10-28 DIAGNOSIS — Z79899 Other long term (current) drug therapy: Secondary | ICD-10-CM | POA: Diagnosis not present

## 2023-10-28 DIAGNOSIS — R030 Elevated blood-pressure reading, without diagnosis of hypertension: Principal | ICD-10-CM

## 2023-10-28 LAB — CBC WITH DIFFERENTIAL/PLATELET
Abs Immature Granulocytes: 0.12 10*3/uL — ABNORMAL HIGH (ref 0.00–0.07)
Basophils Absolute: 0 10*3/uL (ref 0.0–0.1)
Basophils Relative: 0 %
Eosinophils Absolute: 0.2 10*3/uL (ref 0.0–0.5)
Eosinophils Relative: 3 %
HCT: 32.2 % — ABNORMAL LOW (ref 36.0–46.0)
Hemoglobin: 9.9 g/dL — ABNORMAL LOW (ref 12.0–15.0)
Immature Granulocytes: 1 %
Lymphocytes Relative: 18 %
Lymphs Abs: 1.7 10*3/uL (ref 0.7–4.0)
MCH: 25.4 pg — ABNORMAL LOW (ref 26.0–34.0)
MCHC: 30.7 g/dL (ref 30.0–36.0)
MCV: 82.8 fL (ref 80.0–100.0)
Monocytes Absolute: 0.7 10*3/uL (ref 0.1–1.0)
Monocytes Relative: 7 %
Neutro Abs: 6.8 10*3/uL (ref 1.7–7.7)
Neutrophils Relative %: 71 %
Platelets: 249 10*3/uL (ref 150–400)
RBC: 3.89 MIL/uL (ref 3.87–5.11)
RDW: 15.4 % (ref 11.5–15.5)
WBC: 9.6 10*3/uL (ref 4.0–10.5)
nRBC: 0 % (ref 0.0–0.2)

## 2023-10-28 LAB — URINALYSIS, ROUTINE W REFLEX MICROSCOPIC
Bilirubin Urine: NEGATIVE
Glucose, UA: NEGATIVE mg/dL
Hgb urine dipstick: NEGATIVE
Ketones, ur: NEGATIVE mg/dL
Leukocytes,Ua: NEGATIVE
Nitrite: NEGATIVE
Protein, ur: NEGATIVE mg/dL
Specific Gravity, Urine: 1.005 (ref 1.005–1.030)
pH: 7 (ref 5.0–8.0)

## 2023-10-28 LAB — COMPREHENSIVE METABOLIC PANEL WITH GFR
ALT: 13 U/L (ref 0–44)
AST: 19 U/L (ref 15–41)
Albumin: 2.7 g/dL — ABNORMAL LOW (ref 3.5–5.0)
Alkaline Phosphatase: 87 U/L (ref 38–126)
Anion gap: 6 (ref 5–15)
BUN: 10 mg/dL (ref 6–20)
CO2: 27 mmol/L (ref 22–32)
Calcium: 9.1 mg/dL (ref 8.9–10.3)
Chloride: 104 mmol/L (ref 98–111)
Creatinine, Ser: 0.59 mg/dL (ref 0.44–1.00)
GFR, Estimated: >60 mL/min (ref >60)
Glucose, Bld: 89 mg/dL (ref 70–99)
Potassium: 4.1 mmol/L (ref 3.5–5.1)
Sodium: 137 mmol/L (ref 135–145)
Total Bilirubin: 0.4 mg/dL (ref 0.0–1.2)
Total Protein: 6.2 g/dL — ABNORMAL LOW (ref 6.5–8.1)

## 2023-10-28 LAB — PROTEIN / CREATININE RATIO, URINE
Creatinine, Urine: 34 mg/dL
Total Protein, Urine: <6 mg/dL

## 2023-10-28 LAB — MAGNESIUM: Magnesium: 1.8 mg/dL (ref 1.7–2.4)

## 2023-10-28 MED ORDER — METOCLOPRAMIDE HCL 10 MG PO TABS
10.0000 mg | ORAL_TABLET | Freq: Once | ORAL | Status: AC
  Administered 2023-10-28: 10 mg via ORAL
  Filled 2023-10-28: qty 1

## 2023-10-28 MED ORDER — LABETALOL HCL 100 MG PO TABS
200.0000 mg | ORAL_TABLET | Freq: Once | ORAL | Status: AC
  Administered 2023-10-28: 200 mg via ORAL
  Filled 2023-10-28: qty 2

## 2023-10-28 MED ORDER — BUTALBITAL-APAP-CAFFEINE 50-325-40 MG PO TABS
2.0000 | ORAL_TABLET | ORAL | Status: DC | PRN
  Administered 2023-10-28: 2 via ORAL
  Filled 2023-10-28: qty 2

## 2023-10-28 MED ORDER — LABETALOL HCL 400 MG PO TABS: 1.0000 | ORAL_TABLET | Freq: Two times a day (BID) | ORAL | 3 refills | Status: DC

## 2023-10-28 MED ORDER — LABETALOL HCL 100 MG PO TABS
200.0000 mg | ORAL_TABLET | Freq: Two times a day (BID) | ORAL | Status: DC
  Administered 2023-10-28: 200 mg via ORAL
  Filled 2023-10-28: qty 2

## 2023-10-28 MED ORDER — ACETAMINOPHEN 500 MG PO TABS
1000.0000 mg | ORAL_TABLET | Freq: Once | ORAL | Status: AC
  Administered 2023-10-28: 1000 mg via ORAL
  Filled 2023-10-28: qty 2

## 2023-10-28 MED ORDER — FUROSEMIDE 10 MG/ML IJ SOLN
10.0000 mg | Freq: Once | INTRAMUSCULAR | Status: AC
  Administered 2023-10-28: 10 mg via INTRAVENOUS
  Filled 2023-10-28: qty 1

## 2023-10-28 MED ORDER — OXYCODONE HCL 5 MG PO TABS: 5.0000 mg | ORAL_TABLET | ORAL | Status: DC | PRN

## 2023-10-28 MED ORDER — IBUPROFEN 600 MG PO TABS
600.0000 mg | ORAL_TABLET | Freq: Four times a day (QID) | ORAL | Status: DC | PRN
  Administered 2023-10-28: 600 mg via ORAL
  Filled 2023-10-28: qty 1

## 2023-10-28 MED ORDER — OXYCODONE HCL 5 MG PO TABS: 5.0000 mg | ORAL_TABLET | ORAL | 0 refills | Status: DC | PRN

## 2023-10-28 MED ORDER — IBUPROFEN 600 MG PO TABS: 600.0000 mg | ORAL_TABLET | Freq: Four times a day (QID) | ORAL | Status: DC | PRN

## 2023-10-28 MED ORDER — FUROSEMIDE 20 MG PO TABS: 20.0000 mg | ORAL_TABLET | Freq: Every day | ORAL | 0 refills | Status: DC

## 2023-10-28 MED ORDER — POTASSIUM CHLORIDE CRYS ER 10 MEQ PO TBCR
20.0000 meq | EXTENDED_RELEASE_TABLET | Freq: Once | ORAL | Status: AC
  Administered 2023-10-28: 20 meq via ORAL
  Filled 2023-10-28: qty 2

## 2023-10-28 MED ORDER — LABETALOL HCL 5 MG/ML IV SOLN
10.0000 mg | Freq: Once | INTRAVENOUS | Status: AC
  Administered 2023-10-28: 10 mg via INTRAVENOUS
  Filled 2023-10-28: qty 4

## 2023-10-28 MED ORDER — POTASSIUM CHLORIDE CRYS ER 20 MEQ PO TBCR: 20.0000 meq | EXTENDED_RELEASE_TABLET | Freq: Two times a day (BID) | ORAL | 0 refills | Status: AC

## 2023-10-28 NOTE — ED Notes (Signed)
 Pt taken to room 6 via w/c and placed on card monitor for further evaluation; Dr Peggi Bowels notified of pt's arrival and CC

## 2023-10-28 NOTE — ED Provider Notes (Signed)
 [facility] Provider Note    None    (approximate)   History   Wound Check   HPI  Joyce Smith is a 23 y.o. female who is 38 weeks and 2 days who was admitted for a C-section due to rupture of membranes.  Patient had non-complicated postpartum course.  I did review her discharge note from 10/25/2023 and her blood pressures were ranging in the 130s to 140s systolic.  Patient is on labetalol  200 twice daily and magnesium  once daily.   Patient reports that she is been compliant with her labetalol .  She reports that she came in today initially because of concerns for some bleeding from her postop scar.  But when they were asking her questions on the line she did mention that she also had a headache.  She reports having these intermittent headaches since delivery.  She denies it being sudden and severe in onset.  It was gradually increasing.  Reports having headaches this similar character and strength before.  She reports taking ibuprofen  which helped the pain go away but then came back an hour later.  She denies any vision changes, fevers or other concerns.  No urinary symptoms.   Physical Exam   Triage Vital Signs: ED Triage Vitals  Encounter Vitals Group     BP 10/28/23 0641 (!) 159/99     Girls Systolic BP Percentile --      Girls Diastolic BP Percentile --      Boys Systolic BP Percentile --      Boys Diastolic BP Percentile --      Pulse Rate 10/28/23 0641 99     Resp 10/28/23 0641 20     Temp 10/28/23 0641 97.9 F (36.6 C)     Temp Source 10/28/23 0641 Oral     SpO2 10/28/23 0641 100 %     Weight 10/28/23 0638 (!) 301 lb (136.5 kg)     Height 10/28/23 0638 5' 4 (1.626 m)     Head Circumference --      Peak Flow --      Pain Score 10/28/23 0638 8     Pain Loc --      Pain Education --      Exclude from Growth Chart --     Most recent vital signs: Vitals:   10/28/23 0649 10/28/23 0656  BP: (!) 171/95   Pulse:  85  Resp:  11  Temp:     SpO2:  100%     General: Awake, no distress.  CV:  Good peripheral perfusion.  Resp:  Normal effort.  Abd:  No distention.  Other:  Cranial nerves II to XII are intact.  Equal strength in arms and legs.  Extraocular movements are intact Patient had some old blood noted on the dressing I did peel this up and did not see any active bleeding from her incision site  ED Results / Procedures / Treatments   Labs (all labs ordered are listed, but only abnormal results are displayed) Labs Reviewed  CBC WITH DIFFERENTIAL/PLATELET  COMPREHENSIVE METABOLIC PANEL WITH GFR  URINALYSIS, ROUTINE W REFLEX MICROSCOPIC  MAGNESIUM   PROTEIN / CREATININE RATIO, URINE     PROCEDURES:  Critical Care performed: No  Procedures   MEDICATIONS ORDERED IN ED: Medications  acetaminophen  (TYLENOL ) tablet 1,000 mg (has no administration in time range)  labetalol  (NORMODYNE ) injection 10 mg (10 mg Intravenous Given 10/28/23 0723)     IMPRESSION / MDM / ASSESSMENT AND  PLAN / ED COURSE  I reviewed the triage vital signs and the nursing notes.   Patient's presentation is most consistent with acute presentation with potential threat to life or bodily function.   Patient comes in with initial concern of bleeding from her postoperative incision site.  Evaluated this area no active bleeding at this time.  Also noted to have a headache and elevated blood pressures concerning for chronic hypertension versus preeclampsia versus eclampsia.  No vision changes extraocular movements are intact low suspicion for venous thrombus.  Headache was gradually increasing not sudden or severe in onset or worst headache of her life so unlikely subarachnoid.  No indication for CT imaging at this time given story and given reassuring neurological examination however given patient's blood pressure I suspect that it could be related to this therefore we will treat with some Tylenol , labetalol .  I have called the midwife  AnnieThompson who wanted to hold off on magnesium  at this time but patient can be transported to L&D for further workup and management. They can monitor her headache and neurological function there to decide if further workup and medications are needed given this is a recent post partum patient.      FINAL CLINICAL IMPRESSION(S) / ED DIAGNOSES   Final diagnoses:  Elevated blood pressure reading  H/O cesarean section     Rx / DC Orders   ED Discharge Orders     None        Note:  This document was prepared using Dragon voice recognition software and may include unintentional dictation errors.   Lubertha Rush, MD 10/28/23 [phone]
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
## 2023-10-28 NOTE — OB Triage Note (Addendum)
 Patient is a Postpartum c/s from 10/25/23 and has returned initially for bleeding at the incision site. Upon assessment, patient found to have a constant headache and elevated BP. Patient reports taking labetalol  200mg  BID at home. Denies blurry vision and epigastric pain. Initial BP 150/81, cycling q .

## 2023-10-28 NOTE — ED Triage Notes (Addendum)
 Pt to triage via w/c with no distress noted; c-section on Monday, d/c yesterday; noted bleeding from site since 2am; upon inspection of surgical site---clear adhesive dressing in place covering foam dressing which appears to have light red drainage; pt also reports frontal HA since d/c; ibuprofen  taken at 330am; last ds labetolol last night

## 2023-10-28 NOTE — Telephone Encounter (Signed)
 Joyce Smith

## 2023-10-28 NOTE — Telephone Encounter (Signed)
 Chart reviewed. Patient reported to ED as advised.

## 2023-10-28 NOTE — OB Triage Note (Signed)
 Obstetric H&P   Chief Complaint: headache  Prenatal Care Provider: AOB  History of Present Illness: 23 y.o. G3P2012s/p RLTC on 6/16 presenting to L&D for a persistent headache and leaking incision.  The headache started shortly after she got home yesterday around 1800. The pain is in the front, behind her eyes and down towards her jaw, it is throbbing. She took Motrin  at 0300, which did not help. At 0200 she noticed her dressing was wet, she put a dry wash cloth on it which was then soaked through with drainage.   Of note Joyce Smith has a history of CHTN on labaetalol 200mg  BID, her discharge was delayed d/t to a headache but she was ultimately released yesterday evening. She reports they gave her something in the hospital that helped her headache. On review of her med list, she believes the medication that helped was Percocet, she was not able to pick it  up at the [facility] pharmacy was not able to fill it. She was given 1,000mg  Tylenol  when she first arrived the ED, at that time her headache was 8/10, currently she rates it a 6/10. Denies N/V, visual changes, or RUQ. The pain does not change based on her position. Her BP was noted to be in the severe range, she was given 10mg  Labetalol  IV in the ED. She has not yet taken her morning dose of Labetalol .     Problem List     No episode was linked to this visit.        Review of Systems: 10 point review of systems negative unless otherwise noted in HPI  Past Medical History: Patient Active Problem List   Diagnosis Date Noted   Pregnancy 10/25/2023   [redacted] weeks gestation of pregnancy 10/22/2023   Indication for care in labor and delivery, antepartum 10/08/2023   Labor and delivery indication for care or intervention 09/20/2023   Vaginal irritation 09/16/2023   Leg pain 09/15/2023   Obesity, morbid, BMI 50 or higher (HCC) 04/30/2023    -Anesthesia consult -Early 1-hour- completed -Growth scans in 3rd trimester -NSTs at 36w     History of cesarean delivery 04/30/2023    -desires repeat -Needs MD consult in 3rd trimester    Supervision of other normal pregnancy, antepartum 03/26/2023     Clinical Staff Provider  Office Location  [facility] Dating  Not found.  Language  English Anatomy US     Flu Vaccine  offer Genetic Screen  NIPS:   TDaP vaccine   09/02/23 Hgb A1C or  GTT Early : Third trimester :   Covid declined   LAB RESULTS   Rhogam  O/Positive/-- (12/20 1120)  Blood Type O/Positive/-- (12/20 1120)   RSV offer Antibody Negative (12/20 1120)  Feeding Plan formula Rubella 5.07 (12/20 1120)4.34  11/06/21  Contraception none RPR Non Reactive (04/01 1019)   Circumcision no HBsAg Negative (12/20 1120) Neg  11/01/21  Pediatrician  Med Peds HIV Non Reactive (04/01 1019)Non reactive  11/06/21  Support Person Cathlean Co Varicella Reactive (12/20 1120)Neg  11/06/21  Prenatal Classes no GBS  (For PCN allergy, check sensitivities)     Hep C Non Reactive (12/20 1120) Non reactive  06/16/22  BTL Consent  Pap Diagnosis  Date Value Ref Range Status  04/30/2023   Final   - Negative for intraepithelial lesion or malignancy (NILM)    VBAC Consent  Hgb Electro    TSH 1.854  01/25/23 CF   A1c   5.0  01/25/23 SMA  GC/CHLM NEG/NEG  11/28/22 TOX IgG/IgM Both <3.0  11/01/21       Obesity affecting pregnancy in third trimester 04/09/2022   High-risk pregnancy, third trimester 02/09/2022   Chronic hypertension in pregnancy 08/27/2021    -Labetalol  100 mg PO BID -MFM referral - completed -serial growth at 28 weeks -antenatal surveillance beginning at 32 weeks -Baseline preeclampsia labs- completed -Delivery likely by 38 weeks per MFM     Asthma 01/30/2019    Symbicort  inhaler    Depression dx'd 2015 01/30/2019    Past Surgical History: Past Surgical History:  Procedure Laterality Date   CESAREAN SECTION  04/23/2022   Procedure: CESAREAN SECTION;  Surgeon: Zenobia Hila, MD;  Location: [facility] ORS;  Service:  Obstetrics;;   CESAREAN SECTION N/A 10/25/2023   Procedure: CESAREAN DELIVERY;  Surgeon: Zenobia Hila, MD;  Location: [facility] ORS;  Service: Obstetrics;  Laterality: N/A;  Repeat Cesarean Delivery    Past Obstetric History: # 1 - Date: 06/20/21, Sex: Unknown, Weight: None, GA: [redacted]w[redacted]d, Type: None, Apgar1: None, Apgar5: None, Living: None, Birth Comments: None  # 2 - Date: 04/23/22, Sex: Female, Weight: 3050 g, GA: [redacted]w[redacted]d, Type: C-Section, Low Transverse, Apgar1: 8, Apgar5: 9, Living: Living, Birth Comments: None  # 3 - Date: 10/25/23, Sex: Female, Weight: 3310 g, GA: [redacted]w[redacted]d, Type: C-Section, Low Transverse, Apgar1: 8, Apgar5: 8, Living: Living, Birth Comments: None   Past Gynecologic History:  Family History: Family History  Problem Relation Age of Onset   Other Mother        mental disorders   Hyperlipidemia Mother    Asthma Mother    Hypertension Mother    Thyroid  disease Mother    Sickle cell trait Mother    Other Father        mental disorders   Hypertension Father    Autism Brother    ADD / ADHD Brother    Stroke Maternal Grandmother    Healthy Maternal Grandfather    Other Paternal Grandmother        lymphedema   Heart failure Paternal Grandmother    Diabetes Paternal Grandmother    Hypertension Paternal Grandmother    Rashes / Skin problems Paternal Grandmother    Stroke Paternal Grandmother    Breast cancer Paternal Grandmother        90s   Skin cancer Paternal Grandmother        12s   Kidney failure Paternal Grandmother    Blindness Paternal Grandmother        one eye   Hypertension Paternal Grandfather    Heart disease Neg Hx     Social History: Social History   Socioeconomic History   Marital status: Single    Spouse name: Not on file   Number of children: 1   Years of education: 12   Highest education level: Not on file  Occupational History   Occupation: stay at home Mom  Tobacco Use   Smoking status: Never   Smokeless tobacco: Never  Vaping Use    Vaping status: Never Used  Substance and Sexual Activity   Alcohol use: No   Drug use: Not Currently    Types: Marijuana   Sexual activity: Yes    Partners: Male    Birth control/protection: None  Other Topics Concern   Not on file  Social History Narrative   Not on file   Social Drivers of Health   Financial Resource Strain: Medium Risk (03/26/2023)   Overall Financial Resource Strain (CARDIA)  Difficulty of Paying Living Expenses: Somewhat hard  Food Insecurity: No Food Insecurity (10/25/2023)   Hunger Vital Sign    Worried About Running Out of Food in the Last Year: Never true    Ran Out of Food in the Last Year: Never true  Transportation Needs: No Transportation Needs (10/25/2023)   PRAPARE - Administrator, Civil Service (Medical): No    Lack of Transportation (Non-Medical): No  Physical Activity: Sufficiently Active (03/26/2023)   Exercise Vital Sign    Days of Exercise per Week: 3 days    Minutes of Exercise per Session: 50 min  Stress: No Stress Concern Present (03/26/2023)   [employer] of Occupational Health - Occupational Stress Questionnaire    Feeling of Stress : Not at all  Social Connections: Moderately Integrated (10/25/2023)   Social Connection and Isolation Panel    Frequency of Communication with Friends and Family: More than three times a week    Frequency of Social Gatherings with Friends and Family: Three times a week    Attends Religious Services: 1 to 4 times per year    Active Member of Clubs or Organizations: No    Attends Banker Meetings: Never    Marital Status: Living with partner  Intimate Partner Violence: Not At Risk (10/25/2023)   Humiliation, Afraid, Rape, and Kick questionnaire    Fear of Current or Ex-Partner: No    Emotionally Abused: No    Physically Abused: No    Sexually Abused: No    Medications: Prior to Admission medications   Medication Sig Start Date End Date Taking? Authorizing Provider   labetalol  (NORMODYNE ) 200 MG tablet Take 1 tablet (200 mg total) by mouth 2 (two) times daily. 08/19/23  Yes Tobb, Kardie, DO  Magnesium  250 MG TABS Take 1 tablet by mouth daily at 6 (six) AM.    [provider]  oxyCODONE -acetaminophen  (PERCOCET/ROXICET) 5-325 MG tablet Take 1-2 tablets by mouth every 4 (four) hours as needed for moderate pain (pain score 4-6). 10/27/23   Alise Appl, CNM  prenatal vitamin w/FE, FA (NATACHEW) 29-1 MG CHEW chewable tablet Chew 2 tablets by mouth daily at 12 noon.    [provider]  Probiotic Product (PROBIOTIC PO) Take 1 tablet by mouth daily.    [provider]  SYMBICORT  80-4.5 MCG/ACT inhaler INHALE 2 PUFFS BY MOUTH INTO THE LUNGS DAILY Patient taking differently: 2 puffs as needed. 03/18/23   Teresa Fender, MD    Allergies: No Known Allergies  Physical Exam: Vitals: Blood pressure (!) 153/78, pulse 88, temperature 98.4 F (36.9 C), temperature source Oral, resp. rate 20, height 5' 4 (1.626 m), weight (!) 136.5 kg, SpO2 100%, unknown if currently breastfeeding.   General: NAD HEENT: normocephalic, anicteric Pulmonary: No increased work of breathing, CTAB  Cardiovascular: RRR, distal pulses 2+ Reflexes +2 brachial  Incision: honeycomb saturated with serosanguinous fluid, dressing removed, no active bleeding. incision well approximated, there is 1 cm area on the right that is open but does not penetrate through. New steri strips  and honey dressing applied.   Extremities: no edema, erythema, or tenderness Neurologic: Grossly intact Psychiatric: mood appropriate, affect full  Labs: Results for orders placed or performed during the hospital encounter of 10/28/23 (from the past 24 hours)  CBC with Differential     Status: Abnormal   Collection Time: 10/28/23  7:25 AM  Result Value Ref Range   WBC 9.6 4.0 - 10.5 K/uL   RBC 3.89 3.87 -  5.11 MIL/uL   Hemoglobin 9.9 (L) 12.0 - 15.0 g/dL   HCT 16.1 (L) 09.6 - 04.5 %   MCV  82.8 80.0 - 100.0 fL   MCH 25.4 (L) 26.0 - 34.0 pg   MCHC 30.7 30.0 - 36.0 g/dL   RDW 40.9 81.1 - 91.4 %   Platelets 249 150 - 400 K/uL   nRBC 0.0 0.0 - 0.2 %   Neutrophils Relative % 71 %   Neutro Abs 6.8 1.7 - 7.7 K/uL   Lymphocytes Relative 18 %   Lymphs Abs 1.7 0.7 - 4.0 K/uL   Monocytes Relative 7 %   Monocytes Absolute 0.7 0.1 - 1.0 K/uL   Eosinophils Relative 3 %   Eosinophils Absolute 0.2 0.0 - 0.5 K/uL   Basophils Relative 0 %   Basophils Absolute 0.0 0.0 - 0.1 K/uL   Immature Granulocytes 1 %   Abs Immature Granulocytes 0.12 (H) 0.00 - 0.07 K/uL  Comprehensive metabolic panel     Status: Abnormal   Collection Time: 10/28/23  7:25 AM  Result Value Ref Range   Sodium 137 135 - 145 mmol/L   Potassium 4.1 3.5 - 5.1 mmol/L   Chloride 104 98 - 111 mmol/L   CO2 27 22 - 32 mmol/L   Glucose, Bld 89 70 - 99 mg/dL   BUN 10 6 - 20 mg/dL   Creatinine, Ser 7.82 0.44 - 1.00 mg/dL   Calcium 9.1 8.9 - 95.6 mg/dL   Total Protein 6.2 (L) 6.5 - 8.1 g/dL   Albumin 2.7 (L) 3.5 - 5.0 g/dL   AST 19 15 - 41 U/L   ALT 13 0 - 44 U/L   Alkaline Phosphatase 87 38 - 126 U/L   Total Bilirubin 0.4 0.0 - 1.2 mg/dL   GFR, Estimated >21 >30 mL/min   Anion gap 6 5 - 15  Magnesium      Status: None   Collection Time: 10/28/23  7:25 AM  Result Value Ref Range   Magnesium  1.8 1.7 - 2.4 mg/dL  Urinalysis, Routine w reflex microscopic -Urine, Clean Catch     Status: Abnormal   Collection Time: 10/28/23  8:33 AM  Result Value Ref Range   Color, Urine STRAW (A) YELLOW   APPearance CLEAR (A) CLEAR   Specific Gravity, Urine 1.005 1.005 - 1.030   pH 7.0 5.0 - 8.0   Glucose, UA NEGATIVE NEGATIVE mg/dL   Hgb urine dipstick NEGATIVE NEGATIVE   Bilirubin Urine NEGATIVE NEGATIVE   Ketones, ur NEGATIVE NEGATIVE mg/dL   Protein, ur NEGATIVE NEGATIVE mg/dL   Nitrite NEGATIVE NEGATIVE   Leukocytes,Ua NEGATIVE NEGATIVE    Assessment: 23 y.o. [id number] with headache and leaking incision.   Plan: 1)  Dressing changed-see above note  2) Labetalol  200mg  BID ordered   3) Labs WNL UPC pending   4) Motrin , Reglan and Oxycodone  ordered   5) Disposition - anticipate discharge later today   [facility] 10/28/2023, 9:44 AM

## 2023-10-28 NOTE — OB Triage Note (Signed)
 Discharge instructions, postpartum precautions, and follow-up care reviewed with patient and significant other. All questions answered. Patient verbalized understanding. Discharged ambulatory off unit.

## 2023-10-29 ENCOUNTER — Observation Stay
Admission: EM | Admit: 2023-10-29 | Discharge: 2023-10-29 | Disposition: A | Discharge status: Home / Self Care | Attending: Certified Nurse Midwife | Admitting: Certified Nurse Midwife

## 2023-10-29 ENCOUNTER — Encounter: Payer: Self-pay | Admitting: Obstetrics and Gynecology

## 2023-10-29 DIAGNOSIS — O99893 Other specified diseases and conditions complicating puerperium: Secondary | ICD-10-CM

## 2023-10-29 DIAGNOSIS — R519 Headache, unspecified: Secondary | ICD-10-CM | POA: Insufficient documentation

## 2023-10-29 DIAGNOSIS — O895 Other complications of spinal and epidural anesthesia during the puerperium: Principal | ICD-10-CM | POA: Insufficient documentation

## 2023-10-29 LAB — COMPREHENSIVE METABOLIC PANEL WITH GFR
ALT: 20 U/L (ref 0–44)
AST: 26 U/L (ref 15–41)
Albumin: 2.9 g/dL — ABNORMAL LOW (ref 3.5–5.0)
Alkaline Phosphatase: 87 U/L (ref 38–126)
Anion gap: 8 (ref 5–15)
BUN: 10 mg/dL (ref 6–20)
CO2: 24 mmol/L (ref 22–32)
Calcium: 8.8 mg/dL — ABNORMAL LOW (ref 8.9–10.3)
Chloride: 105 mmol/L (ref 98–111)
Creatinine, Ser: 0.61 mg/dL (ref 0.44–1.00)
GFR, Estimated: >60 mL/min (ref >60)
Glucose, Bld: 95 mg/dL (ref 70–99)
Potassium: 4.1 mmol/L (ref 3.5–5.1)
Sodium: 137 mmol/L (ref 135–145)
Total Bilirubin: 0.7 mg/dL (ref 0.0–1.2)
Total Protein: 6.7 g/dL (ref 6.5–8.1)

## 2023-10-29 LAB — CBC
HCT: 32.7 % — ABNORMAL LOW (ref 36.0–46.0)
Hemoglobin: 10.2 g/dL — ABNORMAL LOW (ref 12.0–15.0)
MCH: 25.4 pg — ABNORMAL LOW (ref 26.0–34.0)
MCHC: 31.2 g/dL (ref 30.0–36.0)
MCV: 81.5 fL (ref 80.0–100.0)
Platelets: 294 10*3/uL (ref 150–400)
RBC: 4.01 MIL/uL (ref 3.87–5.11)
RDW: 15.2 % (ref 11.5–15.5)
WBC: 9.1 10*3/uL (ref 4.0–10.5)
nRBC: 0 % (ref 0.0–0.2)

## 2023-10-29 MED ORDER — LABETALOL HCL 5 MG/ML IV SOLN: 80.0000 mg | INTRAVENOUS | Status: DC | PRN

## 2023-10-29 MED ORDER — ACETAMINOPHEN 500 MG PO TABS
1000.0000 mg | ORAL_TABLET | Freq: Four times a day (QID) | ORAL | Status: DC | PRN
  Administered 2023-10-29: 1000 mg via ORAL
  Filled 2023-10-29: qty 2

## 2023-10-29 MED ORDER — BUTALBITAL-APAP-CAFFEINE 50-325-40 MG PO TABS
2.0000 | ORAL_TABLET | Freq: Once | ORAL | Status: DC
  Filled 2023-10-29: qty 2

## 2023-10-29 MED ORDER — CYCLOBENZAPRINE HCL 5 MG PO TABS
10.0000 mg | ORAL_TABLET | Freq: Once | ORAL | Status: AC
  Administered 2023-10-29: 10 mg via ORAL
  Filled 2023-10-29: qty 2

## 2023-10-29 MED ORDER — LABETALOL HCL 5 MG/ML IV SOLN: 20.0000 mg | INTRAVENOUS | Status: DC | PRN

## 2023-10-29 MED ORDER — HYDRALAZINE HCL 20 MG/ML IJ SOLN: 10.0000 mg | INTRAMUSCULAR | Status: DC | PRN

## 2023-10-29 MED ORDER — LABETALOL HCL 5 MG/ML IV SOLN: 40.0000 mg | INTRAVENOUS | Status: DC | PRN

## 2023-10-29 MED ORDER — IBUPROFEN 800 MG PO TABS
800.0000 mg | ORAL_TABLET | Freq: Once | ORAL | Status: AC
  Administered 2023-10-29: 800 mg via ORAL
  Filled 2023-10-29: qty 1

## 2023-10-29 MED ORDER — CYCLOBENZAPRINE HCL 10 MG PO TABS: 10.0000 mg | ORAL_TABLET | Freq: Three times a day (TID) | ORAL | 2 refills | Status: AC | PRN

## 2023-10-29 NOTE — Consult Note (Signed)
 S: Patient is a 23 year old female 4 days post partum who is presenting for evaluation of headache x3 days.  Patient's headache first started on Wednesday (PPD 2), but was not thought to be related to her c-section at the time.  OB is now requesting that we evaluate for PDPH.  Associated symptoms include right ear and jaw pain, maybe some difficulty hearing certain frequencies (certain voices), and pain in the shoulders and back.  She also notes that the pain does inmprove, but not go away when she lies down.  It gets worse again upon sitting.  Of additional note, the patient has not had caffeine since the birth or her child due to worry over hypertension, and she was previously a daily caffeine drinker.  Additionally, the patient noted that her dentist wanted her referred to ENT for evaluation of either nasal polyps or TMJ (patient is unsure).  O:  Patient is morbidly obese, well appearing, but does appear to be in discomfort.  Patient was sitting upright upon entering the room.  BP elevated to 156/96; normocephalic, atraumatic; pulse is regular; breathing easily without discomfort;  A/P:  Patient is a morbidly obese post partum female presenting with a 3 day long headache.  PDPH is definitely in the differential at this time, but the picture is very complicated given the other parts of her history.  I would suggest that we try conservative treatment for her headache at this time before proceeding to things like imaging.  First line would be to give her fluid and caffeine as well as control her blood pressure, as all of those things could be contributing to her headache.  Additionally, the ear pain is not something that we usually see with PDPH and could be related to the issue her dentist wanted her to see ENT for.  If conservative management fails, I think it could be beneficial to get neurology on board and get some imaging.  If this does turn out to be a PDPH we would refer her to the pain clinic for a  blood patch to be done under direct fluoroscopy.  Thank you for this consult.

## 2023-10-29 NOTE — OB Triage Note (Signed)
 Presents with compaint of headache that is 8/10. Pt delivered via C/S on Monday.. States headache started Wed. Afternoon. Describes headache as throbbing, and also has r sided pain in ear and jaw. Pt. Seen here yesterday. PIH labs done. States headache feels a litlle better when she lies down but her ear and jaw still hurts the same. Denies taking any pain medication for headache. States she took pain medication that was prescribed for her incisional pain yesterday but it made her feel funny.  First BP 151/84. Serial Bps started.

## 2023-10-29 NOTE — OB Triage Note (Signed)
 LABOR & DELIVERY OB TRIAGE NOTE  SUBJECTIVE  HPI Joyce Smith is a 23 y.o. [id number] s/p rC/D on 6/16 who presents to Labor & Delivery for headache. She was seen yesterday in triage for a headache that did respond to pain medications. Today she reports the headache has come back along with right ear pain that radiates down her jaw line. She describes the pain as 8/10. She has not taken any pain medication today. She is a regular caffeine drinker but has not had any since her baby was born.   Her labetalol  was increased yesterday from 200mg  BID to 400mg  BID and she has taken that once at 1130am and her Lasix at 1230pm.  OB History     Gravida  3   Para  2   Term  2   Preterm  0   AB  1   Living  2      SAB  1   IAB  0   Ectopic  0   Multiple  0   Live Births  2           Scheduled Meds: Continuous Infusions: PRN Meds:.acetaminophen   OBJECTIVE  BP (!) 159/80   Pulse 99   Temp 98.1 F (36.7 C) (Oral)   Resp 16   General: A&O x 3 Ear exam: no erythema or swelling, tympanic membrane- intact, pearly grey  Hospital course Elevated blood pressure on admission to triage. Ibuprofen , flexeril  and tylenol  given for pain. Low suspicion this is from her epidural from surgery but Dr. Anthony Bateman, anesthesia, consulted for assessment. CBC/CMP labs WNL. UP:C was to low to calculate when assessed yesterday.  ASSESSMENT Impression  1) Pregnancy at [id number], Unknown, Estimated Date of Delivery: None noted. 2) Reassuring maternal/fetal status 3) CMP/CBC WNL. Headache resolved with pain medications and caffeine.   PLAN 1) Reviewed taking pain medications and drinking caffeine with another headache. She has not been eating because she is worried about sodium. Reviewed importance of eating.  2) Keep incision/blood pressure check on 6/24. 3)Return to L&D with headache not relieved with interventions.    Donato Fu, CNM  10/29/23  6:37 PM

## 2023-10-29 NOTE — Progress Notes (Signed)
 I have communicated with E. Slaughterbeck, CNM and reviewed vital signs:  Vitals:   10/29/23 1823 10/29/23 1959  BP: (!) 156/96 (!) 156/90  Pulse: (!) 103 81  Resp:    Temp:      Patient denies any other complaints.  Patient reports no headache and pain 0/10. Provider has given order for discharge.  A discharge order and diagnosis entered by a provider. Discharge instructions reviewed with patient. Patient verbalized understanding, no questions or concerns.

## 2023-11-01 ENCOUNTER — Ambulatory Visit: Admitting: Obstetrics

## 2023-11-01 DIAGNOSIS — T8141XA Infection following a procedure, superficial incisional surgical site, initial encounter: Secondary | ICD-10-CM

## 2023-11-01 DIAGNOSIS — O9089 Other complications of the puerperium, not elsewhere classified: Secondary | ICD-10-CM

## 2023-11-01 MED ORDER — FLUCONAZOLE 150 MG PO TABS: 150.0000 mg | ORAL_TABLET | ORAL | 0 refills | Status: DC

## 2023-11-01 MED ORDER — SULFAMETHOXAZOLE-TRIMETHOPRIM 800-160 MG PO TABS: 1.0000 | ORAL_TABLET | Freq: Two times a day (BID) | ORAL | 0 refills | Status: DC

## 2023-11-01 NOTE — Progress Notes (Signed)
   Postoperative Cesarean Follow-up Visit Joyce Smith is a 23 y.o. [id number] s/p rLTCS at [redacted]w[redacted]d for Prior CD,Chronic HTN  POD#7, here today for incision check. She is concerned for infection, reports drainage and foul odor. Has been showering without changing the dressing and has not aired wound out at all. Dressing was changed on POD#1 in ED, was seen for drainage and they changed it there.   Subjective: The patient is not having any pain. She denies fever, chills, nausea, and vomiting. Eating a regular diet without difficulty.  Is  having regular bowel movements. Activity: normal activities of daily living. Bleeding is kind of light. She has issues with her incision.  Drainage with an odor, dark discharge, noticed the smell 2 days ago.   Objective: BP 135/70   Pulse 83   Ht 5' 4 (1.626 m)   Wt 286 lb (129.7 kg)   Breastfeeding No   BMI 49.09 kg/m  Body mass index is 49.09 kg/m.  General:  alert and no distress  Abdomen: soft, bowel sounds active, non-tender  Incision:   Sutures are intact, incision without dehiscence; foul odor and erythema on incision; left third of incision with a skin flap overhanging incision line, and small amount of granulation tissue. See image.    Assessment/Plan: Joyce Smith is a 23 y.o. [phone] s/p rLTCS, now POD#7, here today out of concern for incision infection due to drainage and foul odor. Incision was probed with sterile q-tip and no dehiscence. Wound was cleansed and topical antibiotic ointment was applied. Pt is scheduled to see Dr. Janit tomorrow for her formal post-op visit. Encouraged her to follow up as scheduled. Unsure if incision is cellulitic vs yeast vs dermatitis from being wet. Out of precaution, will start a course of Bactrim  and Diflucan  today. Discussed cleansing twice daily and patting completely dry, and using wicking material in pannus fold to keep area dry. Emphasized importance of hygiene.   Follow up with Dr. Janit  tomorrow as scheduled.    Estil Mangle, DO [facility]

## 2023-11-02 ENCOUNTER — Encounter: Admitting: Obstetrics and Gynecology

## 2023-11-02 ENCOUNTER — Ambulatory Visit: Admitting: Obstetrics and Gynecology

## 2023-11-02 ENCOUNTER — Encounter: Payer: Self-pay | Admitting: Obstetrics and Gynecology

## 2023-11-02 VITALS — BP 135/84 | HR 77 | Ht 64.0 in | Wt 288.0 lb

## 2023-11-02 DIAGNOSIS — O10919 Unspecified pre-existing hypertension complicating pregnancy, unspecified trimester: Secondary | ICD-10-CM

## 2023-11-02 DIAGNOSIS — O1003 Pre-existing essential hypertension complicating the puerperium: Secondary | ICD-10-CM

## 2023-11-02 DIAGNOSIS — Z9889 Other specified postprocedural states: Secondary | ICD-10-CM

## 2023-11-02 NOTE — Progress Notes (Signed)
 Patient presents today for 1 week postpartum follow-up. She had a cesarean delivery on 6/16. Reports baby is bottle feeding. She states she would not like anything for birth control at this time. EPDS score of  4. Reports concerns regarding her incision, feels like it is infected. Patient was seen in office 6/23 due to this but has not started antibiotic yet, plans to start today. Continues to take Labetalol  400 mg bid, states she does not like being on such a high dose.
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
## 2023-11-10 ENCOUNTER — Encounter: Payer: Self-pay | Admitting: Obstetrics and Gynecology

## 2023-11-10 ENCOUNTER — Ambulatory Visit (INDEPENDENT_AMBULATORY_CARE_PROVIDER_SITE_OTHER): Admitting: Obstetrics and Gynecology

## 2023-11-10 VITALS — BP 126/79 | HR 83 | Wt 283.2 lb

## 2023-11-10 DIAGNOSIS — Z9889 Other specified postprocedural states: Secondary | ICD-10-CM

## 2023-11-10 DIAGNOSIS — O10919 Unspecified pre-existing hypertension complicating pregnancy, unspecified trimester: Secondary | ICD-10-CM
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
## 2023-11-18 ENCOUNTER — Other Ambulatory Visit: Payer: Self-pay

## 2023-11-18 ENCOUNTER — Encounter: Payer: Self-pay | Admitting: Cardiology

## 2023-11-18 ENCOUNTER — Observation Stay
Admission: EM | Admit: 2023-11-18 | Discharge: 2023-11-18 | Disposition: A | Discharge status: Home / Self Care | Attending: Obstetrics | Admitting: Obstetrics

## 2023-11-18 ENCOUNTER — Encounter: Payer: Self-pay | Admitting: Obstetrics and Gynecology

## 2023-11-18 DIAGNOSIS — O1003 Pre-existing essential hypertension complicating the puerperium: Secondary | ICD-10-CM | POA: Diagnosis not present

## 2023-11-18 DIAGNOSIS — O26891 Other specified pregnancy related conditions, first trimester: Principal | ICD-10-CM | POA: Insufficient documentation

## 2023-11-18 DIAGNOSIS — R002 Palpitations: Principal | ICD-10-CM

## 2023-11-18 DIAGNOSIS — O165 Unspecified maternal hypertension, complicating the puerperium: Secondary | ICD-10-CM | POA: Diagnosis present

## 2023-11-18 DIAGNOSIS — Z3A01 Less than 8 weeks gestation of pregnancy: Secondary | ICD-10-CM | POA: Diagnosis not present

## 2023-11-18 DIAGNOSIS — O10011 Pre-existing essential hypertension complicating pregnancy, first trimester: Secondary | ICD-10-CM | POA: Diagnosis not present

## 2023-11-18 MED ORDER — LABETALOL HCL 200 MG PO TABS
400.0000 mg | ORAL_TABLET | Freq: Two times a day (BID) | ORAL | Status: DC
  Filled 2023-11-18: qty 2

## 2023-11-18 MED ORDER — ACETAMINOPHEN 325 MG PO TABS: 650.0000 mg | ORAL_TABLET | ORAL | Status: DC | PRN

## 2023-11-18 MED ORDER — SOD CITRATE-CITRIC ACID 500-334 MG/5ML PO SOLN: 30.0000 mL | ORAL | Status: DC | PRN

## 2023-11-18 MED ORDER — LABETALOL HCL 5 MG/ML IV SOLN: 40.0000 mg | INTRAVENOUS | Status: DC | PRN

## 2023-11-18 MED ORDER — IBUPROFEN 800 MG PO TABS: 800.0000 mg | ORAL_TABLET | Freq: Three times a day (TID) | ORAL | Status: DC | PRN

## 2023-11-18 MED ORDER — LABETALOL HCL 5 MG/ML IV SOLN: 20.0000 mg | INTRAVENOUS | Status: DC | PRN

## 2023-11-18 MED ORDER — HYDROXYZINE HCL 25 MG PO TABS: 50.0000 mg | ORAL_TABLET | Freq: Four times a day (QID) | ORAL | Status: DC | PRN

## 2023-11-18 MED ORDER — LABETALOL HCL 5 MG/ML IV SOLN: 80.0000 mg | INTRAVENOUS | Status: DC | PRN

## 2023-11-18 MED ORDER — LACTATED RINGERS IV SOLN: 500.0000 mL | INTRAVENOUS | Status: DC | PRN

## 2023-11-18 MED ORDER — HYDRALAZINE HCL 20 MG/ML IJ SOLN: 10.0000 mg | INTRAMUSCULAR | Status: DC | PRN

## 2023-11-18 NOTE — Discharge Summary (Signed)
 LABOR & DELIVERY OB TRIAGE NOTE  SUBJECTIVE  HPI Joyce Smith is a 23 y.o. [id number] who s/p CS on 10/25/23 who presents to Labor & Delivery for headache and elevated BP at home. Headache resolved with Tylenol  before presenting to L&D. She has a h/o CHTN and takes labetalol  400 mg BID. She also reports occasional heart palpitations that make her feel lightheaded. This happened during her pregnancy also. During her time in triage, she had 2 initial elevated BPs, but with properly placed cuff, BPs have remained normal. At this time, she denies HA, visual changes, and RUQ pain.   OB History     Gravida  3   Para  2   Term  2   Preterm  0   AB  1   Living  2      SAB  1   IAB  0   Ectopic  0   Multiple  0   Live Births  2           Scheduled Meds:  labetalol   400 mg Oral BID   Continuous Infusions:  lactated ringers      PRN Meds:.acetaminophen , labetalol  **AND** labetalol  **AND** labetalol  **AND** hydrALAZINE  **AND** Measure blood pressure, hydrOXYzine , ibuprofen , lactated ringers , sodium citrate -citric acid   OBJECTIVE  BP (!) 115/56   Pulse 72   Temp 97.7 F (36.5 C) (Oral)   Resp 18   SpO2 96%   General: alert, cooperative, NAD  ASSESSMENT Impression  1) 2.5 weeks PP with CHTN 2) Normotensive 3) Palpitations  PLAN 1) Discharge home with standard return precautions. Keep scheduled 6 week PP visit. 2) Continue current dose of labetalol  3) Outpatient cardiology referral.  Eleanor Canny, CNM 11/18/23  5:04 AM

## 2023-11-18 NOTE — OB Triage Note (Incomplete)
 Migraine started 07/09 @ 1645 took tylenol  at 1800/0000 currently no headache.  BP before meds 2230 151/88  rechecked at 2330:142/91. 0030:182/97 post medications Patient reports heart flutters for the past two days with light headedness and feels unable to breathe.

## 2023-11-18 NOTE — ED Triage Notes (Addendum)
 SABRA
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
## 2023-12-08 ENCOUNTER — Ambulatory Visit: Admitting: Obstetrics and Gynecology

## 2023-12-08 ENCOUNTER — Ambulatory Visit: Attending: Cardiology | Admitting: Physician Assistant

## 2023-12-08 ENCOUNTER — Encounter: Payer: Self-pay | Admitting: Obstetrics and Gynecology

## 2023-12-08 VITALS — BP 128/84 | HR 78 | Ht 64.0 in | Wt 287.1 lb

## 2023-12-08 DIAGNOSIS — O10919 Unspecified pre-existing hypertension complicating pregnancy, unspecified trimester: Secondary | ICD-10-CM

## 2023-12-08 DIAGNOSIS — Z9889 Other specified postprocedural states: Secondary | ICD-10-CM
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
## 2023-12-08 NOTE — Progress Notes (Signed)
 Patient presents today for 6 week postpartum follow-up. Patient had a cesarean delivery on 10/25/23. She states baby is bottle feeding. She states she would like nothing for birth control at this time, going to continue to track her cycle. EPDS score of 4. She states discomfort in her ankles and wrists.

## 2023-12-09 ENCOUNTER — Encounter: Payer: Self-pay | Admitting: Physician Assistant

## 2023-12-14 ENCOUNTER — Ambulatory Visit: Admitting: Obstetrics and Gynecology

## 2023-12-16 NOTE — Progress Notes (Signed)
 [facility] CLIN[facility] NOTE  ASSESSMENT/PLAN:  Assessment & Plan Left knee and leg pain  Hx of Baker's cyst, patient concerned she has DVT, though no risk factors or history of same. No clotting history, postpartum status noted. - Order ultrasound of left leg to rule out DVT. - If ultrasound negative, consider orthopedic referral for Baker's cyst evaluation.   Chief Complaint  Patient presents with  . Knee Pain    Knee pain and left side calf pain (recent dx of baker cyst)     Results     SUBJECTIVE:  History of Present Illness Joyce Smith is a 23 year old female who presents with recurrent left leg pain since her pregnancy.  Leg pain began during pregnancy in May and was diagnosed as a Baker's cyst after an outpatient ultrasound. The pain initially improved but has returned postpartum, particularly severe around and behind the knee, worsening with ambulation. No erythema noted. Magnesium  has been used without relief.  She is concerned about a blood clot, especially postpartum, but has no personal or family history of clots. She is one and a half months postpartum, not on birth control, and not currently pregnant.  #  I have reviewed the patients problem list, medical history, surgical history, laboratory history and recent hospitalizations, current medications, allergies, and social history and updated them as needed.  Review of symptoms:  Negative unless  Otherwise stated in HPI.   Joyce Smith  reports that she quit smoking about 2 years ago. Her smoking use included cigars and cigarettes. She has a 1.3 pack-year smoking history. She has never used smokeless tobacco.  OBJECTIVE:  VITALS:  Vitals:   12/16/23 1507  BP: 129/93  Pulse: 79  Temp: 36.5 C (97.7 F)   Wt:  Wt Readings from Last 3 Encounters:  12/16/23 (!) 128.8 kg (284 lb)  11/22/23 (!) 128.8 kg (284 lb)  11/19/23 (!) 128.9 kg (284 lb 3.2 oz)    Physical  Exam    Constitutional: Alert and oriented. Well appearing and in no distress. Eyes: No scleral icterus. ENT      Head: Normocephalic and atraumatic.      Mouth/Throat: Mucous membranes are moist.      Neck: Supple Cardiovascular: Normal rate. Extremities well perfused. Respiratory: Normal respiratory effort. Good tidal volume.  Musculoskeletal: Patient freely moves all extremities. No warm or swollen joints.  No obvious leg swelling, no erythema. Neurologic: Normal speech and language. No gross focal neurologic deficits are appreciated. Patient acts age appropriate. Skin: Skin is warm, dry and intact. No rash noted. Psychiatric: Mood and affect are normal. Speech and behavior are normal.  LABS: No results found for any visits on 12/16/23.  STUDIES: No results found.  ___________________________________ CURRENT MEDS: Current Medications[1]  ___________________________________ ALLERGIES: Allergies[2] ------------------------  PAST MEDICAL HISTORY:  Past Medical History[3]      Did today's visit save an ED/Direct Admission?  yes  Follow-up as Needed      [facility]  at [facility] [address] [place], [place] [address] . Telephone [phone] . Fax [phone] [email or web address]       [1] Current Outpatient Medications  Medication Sig Dispense Refill  . aspirin  (ECOTRIN) 81 MG tablet Take 2 tablets (162 mg total) by mouth. (Patient not taking: No sig reported)    . cholecalciferol, vitamin D3 25 mcg, 1,000 units,, (CHOLECALCIFEROL-25 MCG, 1,000 UNIT,) 1,000 unit (25 mcg)  tablet Take 1 tablet (25 mcg total) by mouth daily. 30 tablet 11  . clindamycin (CLEOCIN T) 1 % external solution Apply topically two (2) times a day. To scalp PRN flares. Stop when clear. (Patient not taking: Reported on 12/16/2023) 60 mL 2  . clindamycin (CLEOCIN T) 1 % Swab Apply 1 Application topically two (2) times a  day. 60 each 5  . econazole nitrate (SPECTAZOLE) 1 % cream Apply topically two (2) times a day. To affected areas on feet 30 g 5  . ferrous sulfate 325 (65 FE) MG tablet Take 1 tablet (325 mg total) by mouth daily. (Patient not taking: Reported on 11/23/2023) 90 tablet 3  . labetalol  (NORMODYNE ) 100 MG tablet Take 2 tablets (200 mg total) by mouth two (2) times a day.    . olopatadine (PATADAY) 0.2 % ophthalmic solution Administer 1 drop to both eyes two (2) times a day. 5 mL 0  . omeprazole  (PRILOSEC ) 20 MG capsule TAKE 1 CAPSULE BY MOUTH EVERY DAY (Patient not taking: Reported on 11/23/2023) 30 capsule 10  . sucralfate  (CARAFATE ) 1 gram tablet Take 1 tablet (1 g total) by mouth four (4) times a day. (Patient not taking: Reported on 11/23/2023)    . SYMBICORT  80-4.5 mcg/actuation inhaler INHALE 2 PUFFS BY MOUTH INTO THE LUNGS DAILY     No current facility-administered medications for this visit.  [2] No Known Allergies [3] Past Medical History: Diagnosis Date  . Anemia   . Anxiety   . Asthma ([facility])   . Depression   . Heart murmur   . Hypertension   . Obesity   . Peptic ulceration   . Pregnant ([facility])

## 2023-12-21 NOTE — Progress Notes (Signed)
 Assessment & Plan   Assessment & Plan Neck pain Chronic tension pain in neck, upper back, and occipital regions likely due to muscle tension and poor posture. Differential includes muscle tension and postural strain. No evidence of carpal tunnel syndrome, cardiac involvement, epidural-related complications, or vascular dissection. - Prescribed naproxen  325 mg twice daily for pain. - Provided exercises for range of motion and isometric strengthening. - Recommended ice massage for muscle relaxation. - Advised against heat application. - Printed exercise instructions for home practice. Orders: .  naproxen  (NAPROSYN ) 375 MG tablet; Take 1 tablet (375 mg total) by mouth in the morning and 1 tablet (375 mg total) in the evening. Take with meals.  Rhomboid muscle pain - Prescribed naproxen  325 mg twice daily for pain. - Provided exercises for range of motion and isometric strengthening. - Recommended ice massage for muscle relaxation. - Advised against heat application. - Printed exercise instructions for home practice. Orders: .  naproxen  (NAPROSYN ) 375 MG tablet; Take 1 tablet (375 mg total) by mouth in the morning and 1 tablet (375 mg total) in the evening. Take with meals.   I personally spent 30 minutes face-to-face and non-face-to-face in the care of this patient, which includes all pre, intra, and post visit time on the date of service.  SUBJECTIVE  Subjective This is a 23 y.o. female who presents with had concerns including Pain (Pain all throughout the back of pt's head, neck, and back, but not all at once) and Knee Pain.  History of Present Illness Joyce Smith is a 23 year old female who presents with worsening neck and back pain.  She has been experiencing worsening neck and back pain, initially noticed a few days before her last menstrual period. The pain subsided during her period but has since returned and intensified. It is primarily located in the middle of her  neck, the back of her head, and her upper back, sometimes extending to her lower back. The pain is described as achy and worsens as the day progresses, particularly affecting her left side.  She experiences burning sensations in her wrists and ankles. She reported to her OB that she thought she could possibly have carpal tunnel in her hands from her pregnancy, but her OB told her that if she had carpal tunnel, her fingers would only hurt.  She has a history of taking aspirin  during her pregnancy and currently takes vitamin D, clindamycin for her scalp and back, labetalol  for high blood pressure, and Symbicort  as needed for asthma. She is not breastfeeding and reports no allergies.     REVIEW OF SYSTEMS  Past medical history, medications, social history, and allergies reviewed and updated. Remainder of systems review notable for She has a one and a half year old and a two month old. Although she sleeps for about eight hours, she has difficulty falling asleep. She is a side sleeper and has not had any prior injuries or motor vehicle accidents.  She experiences palpitations, which were investigated during her last trimester with an EKG and heart monitor, both of which were unremarkable. She expresses concern about potential heart issues due to these palpitations.     Objective  OBJECTIVE BP 109/75 (BP Site: R Arm, BP Position: Sitting, BP Cuff Size: Large)   Pulse 71   Temp 36.7 C (98.1 F) (Temporal)   Ht 162.6 cm (5' 4.02)   Wt (!) 128.8 kg (284 lb)   LMP 01/31/2023   BMI 48.72 kg/m   Physical Examination  Vital Signs- reviewed General appearance - alert, well appearing, and in no distress Mental status - alert, oriented to person, place, and time Neurological - alert, oriented, normal speech, no focal findings or movement disorder noted  Physical Exam NECK: Pain on neck extension, moderate pain on neck flexion, no pain on neck rotation, pain on lateral neck flexion, tenderness in  cervical paraspinal muscles. MUSCULOSKELETAL: Normal grip strength bilaterally, no pain on elbow extension, no discomfort on wrist flexion, tenderness in rhomboid muscles.   Labs, Imaging, and Other Clinical Data:  I have reviewed the labs, imaging studies, and other clinical data associated with this encounter. See Epic Labs and Imaging section for details.

## 2023-12-24 ENCOUNTER — Encounter: Payer: Self-pay | Admitting: Emergency Medicine

## 2023-12-24 ENCOUNTER — Ambulatory Visit
Admission: EM | Admit: 2023-12-24 | Discharge: 2023-12-24 | Disposition: A | Discharge status: Home / Self Care | Attending: Emergency Medicine | Admitting: Emergency Medicine

## 2023-12-24 DIAGNOSIS — J029 Acute pharyngitis, unspecified: Secondary | ICD-10-CM

## 2023-12-24 LAB — GROUP A STREP BY PCR: Group A Strep by PCR: NOT DETECTED

## 2023-12-24 LAB — MONONUCLEOSIS SCREEN: Mono Screen: NEGATIVE

## 2023-12-24 NOTE — Discharge Instructions (Signed)
 Your strep and monotest are both negative Most likely you have a viral illness: no antibiotic is indicated at this time, May treat with OTC meds of choice. Make sure to drink plenty of fluids to stay hydrated(gatorade, water, popsicles,jello,etc), avoid caffeine  products. Follow up with PCP. Return as needed.
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
## 2023-12-24 NOTE — ED Triage Notes (Signed)
 Patient c/o sore throat for the past 3 days.  Patient reports that her right tonsil is swollen.  Patient denies fevers.
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
## 2023-12-28 ENCOUNTER — Ambulatory Visit: Attending: Cardiology | Admitting: Cardiology

## 2023-12-28 ENCOUNTER — Ambulatory Visit

## 2024-02-14 ENCOUNTER — Ambulatory Visit
Admission: EM | Admit: 2024-02-14 | Discharge: 2024-02-14 | Disposition: A | Discharge status: Home / Self Care | Attending: Family Medicine | Admitting: Family Medicine

## 2024-02-14 ENCOUNTER — Encounter: Payer: Self-pay | Admitting: Emergency Medicine

## 2024-02-14 DIAGNOSIS — R350 Frequency of micturition: Secondary | ICD-10-CM | POA: Diagnosis not present

## 2024-02-14 DIAGNOSIS — H6991 Unspecified Eustachian tube disorder, right ear: Secondary | ICD-10-CM | POA: Insufficient documentation

## 2024-02-14 LAB — PREGNANCY, URINE: Preg Test, Ur: NEGATIVE

## 2024-02-14 LAB — URINALYSIS, W/ REFLEX TO CULTURE (INFECTION SUSPECTED)
Bilirubin Urine: NEGATIVE
Glucose, UA: NEGATIVE mg/dL
Hgb urine dipstick: NEGATIVE
Ketones, ur: NEGATIVE mg/dL
Leukocytes,Ua: NEGATIVE
Nitrite: NEGATIVE
Protein, ur: NEGATIVE mg/dL
Specific Gravity, Urine: 1.025 (ref 1.005–1.030)
pH: 7 (ref 5.0–8.0)

## 2024-02-14 MED ORDER — PHENAZOPYRIDINE HCL 100 MG PO TABS: 100.0000 mg | ORAL_TABLET | Freq: Three times a day (TID) | ORAL | 0 refills | Status: AC | PRN

## 2024-02-14 MED ORDER — FLUTICASONE PROPIONATE 50 MCG/ACT NA SUSP: 1.0000 | Freq: Every day | NASAL | 0 refills | Status: AC

## 2024-02-14 NOTE — Discharge Instructions (Signed)
 The clinic will contact you with results of urine culture done today if positive.  Start Pyridium as needed for your urinary symptoms.  Please note this medication will make your urine orange.  Lots of fluids and rest.  Also start Flonase nasal spray to help with your right ear pain/eustachian tube dysfunction.  I also recommend you start an over-the-counter allergy medicine such as Claritin  or Zyrtec daily for the next 1 to 2 weeks.  Follow-up with your PCP if your symptoms do not improve.  Please go to the ER for any worsening symptoms.  Hope you feel better soon!

## 2024-02-14 NOTE — ED Triage Notes (Signed)
 Pt presents with urinary frequency, abdominal cramping and vaginal irritation x 2 days.Pt also c/o right ear feeling clogged.
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
## 2024-02-15 ENCOUNTER — Encounter: Payer: Self-pay | Admitting: Certified Nurse Midwife

## 2024-02-15 LAB — URINE CULTURE: Culture: <10000 — AB

## 2024-02-16 ENCOUNTER — Ambulatory Visit (HOSPITAL_COMMUNITY): Payer: Self-pay

## 2024-03-12 ENCOUNTER — Other Ambulatory Visit: Payer: Self-pay | Admitting: Licensed Practical Nurse

## 2024-03-13 ENCOUNTER — Encounter: Payer: Self-pay | Admitting: Certified Nurse Midwife

## 2024-03-13 ENCOUNTER — Other Ambulatory Visit: Payer: Self-pay

## 2024-03-13 DIAGNOSIS — I10 Essential (primary) hypertension: Secondary | ICD-10-CM

## 2024-03-13 MED ORDER — LABETALOL HCL 400 MG PO TABS: 1.0000 | ORAL_TABLET | Freq: Two times a day (BID) | ORAL | 0 refills | Status: AC

## 2024-03-13 NOTE — Progress Notes (Signed)
 Pt advised she needs to see her PCP for blood pressure medication now that she is no longer pregnant.  She has an appt with her PCP 03/30/24.  One refill authorized until she can be seen, at which point they will take over this rx for her.

## 2024-04-07 ENCOUNTER — Ambulatory Visit
Admission: EM | Admit: 2024-04-07 | Discharge: 2024-04-07 | Disposition: A | Discharge status: Home / Self Care | Attending: Family Medicine | Admitting: Family Medicine

## 2024-04-07 ENCOUNTER — Encounter: Payer: Self-pay | Admitting: Emergency Medicine

## 2024-04-07 DIAGNOSIS — R109 Unspecified abdominal pain: Secondary | ICD-10-CM

## 2024-04-07 DIAGNOSIS — N926 Irregular menstruation, unspecified: Secondary | ICD-10-CM

## 2024-04-07 LAB — POCT URINE DIPSTICK
Bilirubin, UA: NEGATIVE
Blood, UA: NEGATIVE
Glucose, UA: NEGATIVE mg/dL
Ketones, POC UA: NEGATIVE mg/dL
Leukocytes, UA: NEGATIVE
Nitrite, UA: NEGATIVE
POC PROTEIN,UA: NEGATIVE
Spec Grav, UA: 1.01 (ref 1.010–1.025)
Urobilinogen, UA: 0.2 U/dL
pH, UA: 5.5 (ref 5.0–8.0)

## 2024-04-07 LAB — POCT URINE PREGNANCY: Preg Test, Ur: NEGATIVE
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
## 2024-04-07 NOTE — Discharge Instructions (Addendum)
 My recommendation is that you go to the emergency room for further evaluation of her symptoms

## 2024-04-07 NOTE — ED Triage Notes (Signed)
 Pt c/o pelvic pain (cramping), lower back pain and amenorrhea. Started about 3 days ago. She states she took a home pregnancy test and it was negative. Denies dysuria.
